# Patient Record
Sex: Female | Born: 1960 | Race: White | Hispanic: No | Marital: Married | Smoking: Never smoker
Health system: Southern US, Community
[De-identification: ages and names within clinical notes are randomized; demographics above are authoritative.]

## PROBLEM LIST (undated history)

## (undated) DIAGNOSIS — K219 Gastro-esophageal reflux disease without esophagitis: Secondary | ICD-10-CM

## (undated) DIAGNOSIS — B019 Varicella without complication: Secondary | ICD-10-CM

## (undated) DIAGNOSIS — T7840XA Allergy, unspecified, initial encounter: Secondary | ICD-10-CM

## (undated) DIAGNOSIS — I10 Essential (primary) hypertension: Secondary | ICD-10-CM

## (undated) DIAGNOSIS — E041 Nontoxic single thyroid nodule: Secondary | ICD-10-CM

## (undated) DIAGNOSIS — G629 Polyneuropathy, unspecified: Secondary | ICD-10-CM

## (undated) DIAGNOSIS — K2970 Gastritis, unspecified, without bleeding: Secondary | ICD-10-CM

## (undated) DIAGNOSIS — M1990 Unspecified osteoarthritis, unspecified site: Secondary | ICD-10-CM

## (undated) HISTORY — DX: Gastro-esophageal reflux disease without esophagitis: K21.9

## (undated) HISTORY — DX: Unspecified osteoarthritis, unspecified site: M19.90

## (undated) HISTORY — DX: Polyneuropathy, unspecified: G62.9

## (undated) HISTORY — DX: Nontoxic single thyroid nodule: E04.1

## (undated) HISTORY — PX: OTHER SURGICAL HISTORY: SHX169

## (undated) HISTORY — DX: Gastritis, unspecified, without bleeding: K29.70

## (undated) HISTORY — DX: Essential (primary) hypertension: I10

## (undated) HISTORY — DX: Allergy, unspecified, initial encounter: T78.40XA

## (undated) HISTORY — DX: Varicella without complication: B01.9

---

## 2012-03-21 LAB — HEPATIC FUNCTION PANEL
ALT: 12 U/L (ref 7–35)
AST: 18 U/L (ref 13–35)
Alkaline Phosphatase: 75 U/L (ref 25–125)
Bilirubin, Total: 0.4 mg/dL

## 2012-03-21 LAB — TSH: TSH: 0.95 u[IU]/mL (ref 0.41–5.90)

## 2012-03-21 LAB — BASIC METABOLIC PANEL
BUN: 14 mg/dL (ref 4–21)
Glucose: 79 mg/dL
Sodium: 138 mmol/L (ref 137–147)

## 2012-03-29 ENCOUNTER — Encounter: Payer: Self-pay | Admitting: Internal Medicine

## 2012-04-20 ENCOUNTER — Encounter: Payer: Self-pay | Admitting: *Deleted

## 2012-05-03 ENCOUNTER — Encounter: Payer: Self-pay | Admitting: Internal Medicine

## 2012-05-26 ENCOUNTER — Other Ambulatory Visit (INDEPENDENT_AMBULATORY_CARE_PROVIDER_SITE_OTHER): Payer: 59

## 2012-05-26 ENCOUNTER — Encounter: Payer: Self-pay | Admitting: Internal Medicine

## 2012-05-26 ENCOUNTER — Ambulatory Visit (INDEPENDENT_AMBULATORY_CARE_PROVIDER_SITE_OTHER): Payer: 59 | Admitting: Internal Medicine

## 2012-05-26 VITALS — BP 130/80 | HR 66 | Ht 66.5 in | Wt 178.0 lb

## 2012-05-26 DIAGNOSIS — K3189 Other diseases of stomach and duodenum: Secondary | ICD-10-CM

## 2012-05-26 DIAGNOSIS — R140 Abdominal distension (gaseous): Secondary | ICD-10-CM

## 2012-05-26 DIAGNOSIS — R141 Gas pain: Secondary | ICD-10-CM

## 2012-05-26 DIAGNOSIS — R1013 Epigastric pain: Secondary | ICD-10-CM

## 2012-05-26 DIAGNOSIS — R142 Eructation: Secondary | ICD-10-CM

## 2012-05-26 MED ORDER — ALIGN 4 MG PO CAPS: 1.0000 | ORAL_CAPSULE | Freq: Every day | ORAL | Status: DC

## 2012-05-26 MED ORDER — MOVIPREP 100 G PO SOLR: 1.0000 | Freq: Once | ORAL | Status: DC

## 2012-05-26 NOTE — Patient Instructions (Addendum)
You have been scheduled for an endoscopy and colonoscopy with propofol. Please follow the written instructions given to you at your visit today. Please pick up your prep at the pharmacy within the next 1-3 days.  If you use inhalers (even only as needed), please bring them with you on the day of your procedure.  Your physician has requested that you go to www.startemmi.com and enter the access code given to you at your visit today. This web site gives a general overview about your procedure. However, you should still follow specific instructions given to you by our office regarding your preparation for the procedure.  You have been scheduled for an abdominal ultrasound at [facility] (1st floor of hospital) on 06/01/12 at 8:30 am. Please arrive 15 minutes prior to your appointment for registration. Make certain not to have anything to eat or drink 6 hours prior to your appointment. Should you need to reschedule your appointment, please contact radiology at [phone]. This test typically takes about 30 minutes to perform.  We have given you samples of Align. This puts good bacteria back into your colon. You should take 1 capsule by mouth once daily. If this works well for you, it can be purchased over the counter.  Your physician has requested that you go to the basement for the following lab work before leaving today: CBC, IBC, B12, Sed Rate  We have given you a booklet on a gas prevention diet. Please read over this and follow it.
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
## 2012-05-27 LAB — CBC WITH DIFFERENTIAL/PLATELET
Basophils Absolute: 0 10*3/uL (ref 0.0–0.1)
Eosinophils Relative: 1.1 % (ref 0.0–5.0)
HCT: 42.7 % (ref 36.0–46.0)
Hemoglobin: 14.6 g/dL (ref 12.0–15.0)
Lymphocytes Relative: 41.5 % (ref 12.0–46.0)
Monocytes Relative: 8.1 % (ref 3.0–12.0)
Neutro Abs: 3 10*3/uL (ref 1.4–7.7)
RDW: 12.2 % (ref 11.5–14.6)
WBC: 6.1 10*3/uL (ref 4.5–10.5)

## 2012-05-27 LAB — SEDIMENTATION RATE: Sed Rate: 16 mm/hr (ref 0–22)

## 2012-05-27 LAB — IBC PANEL
Iron: 46 ug/dL (ref 42–145)
Saturation Ratios: 11.2 % — ABNORMAL LOW (ref 20.0–50.0)

## 2012-05-27 LAB — VITAMIN B12: Vitamin B-12: 400 pg/mL (ref 211–911)

## 2012-05-30 ENCOUNTER — Encounter: Payer: Self-pay | Admitting: Internal Medicine

## 2012-06-01 ENCOUNTER — Ambulatory Visit (HOSPITAL_COMMUNITY)
Admission: RE | Admit: 2012-06-01 | Discharge: 2012-06-01 | Disposition: A | Discharge status: Home / Self Care | Payer: 59 | Source: Ambulatory Visit | Attending: Internal Medicine | Admitting: Internal Medicine

## 2012-06-01 DIAGNOSIS — R140 Abdominal distension (gaseous): Secondary | ICD-10-CM

## 2012-06-03 ENCOUNTER — Ambulatory Visit (HOSPITAL_COMMUNITY)
Admission: RE | Admit: 2012-06-03 | Discharge: 2012-06-03 | Disposition: A | Discharge status: Home / Self Care | Payer: 59 | Source: Ambulatory Visit | Attending: Internal Medicine | Admitting: Internal Medicine

## 2012-06-03 DIAGNOSIS — R141 Gas pain: Secondary | ICD-10-CM | POA: Insufficient documentation

## 2012-06-03 DIAGNOSIS — R143 Flatulence: Secondary | ICD-10-CM | POA: Insufficient documentation

## 2012-06-03 DIAGNOSIS — R142 Eructation: Secondary | ICD-10-CM | POA: Insufficient documentation

## 2012-08-03 ENCOUNTER — Encounter: Payer: Self-pay | Admitting: Internal Medicine

## 2012-08-03 ENCOUNTER — Ambulatory Visit (AMBULATORY_SURGERY_CENTER): Payer: 59 | Admitting: Internal Medicine

## 2012-08-03 VITALS — BP 134/85 | HR 60 | Temp 98.3°F | Resp 30 | Ht 66.0 in | Wt 178.0 lb

## 2012-08-03 DIAGNOSIS — R141 Gas pain: Secondary | ICD-10-CM

## 2012-08-03 DIAGNOSIS — K2960 Other gastritis without bleeding: Secondary | ICD-10-CM

## 2012-08-03 DIAGNOSIS — Z1211 Encounter for screening for malignant neoplasm of colon: Secondary | ICD-10-CM

## 2012-08-03 DIAGNOSIS — R143 Flatulence: Secondary | ICD-10-CM

## 2012-08-03 MED ORDER — SODIUM CHLORIDE 0.9 % IV SOLN: 500.0000 mL | INTRAVENOUS | Status: DC

## 2012-08-03 MED ORDER — OMEPRAZOLE 20 MG PO CPDR: 20.0000 mg | DELAYED_RELEASE_CAPSULE | Freq: Every day | ORAL | Status: DC

## 2012-08-03 NOTE — Progress Notes (Signed)
Lidocaine-40mg IV prior to Propofol InductionPropofol given over incremental dosages 

## 2012-08-03 NOTE — Op Note (Signed)
[facility] [address] [place], [address]   COLONOSCOPY PROCEDURE REPORT  PATIENT: Kathleen, Weber  MR#: [id number] BIRTHDATE: January 11, 1961 , 51  yrs. old GENDER: Female ENDOSCOPIST: Hart Carwin, MD REFERRED BY:  none PROCEDURE DATE:  08/03/2012 PROCEDURE:   Colonoscopy, screening ASA CLASS:   Class I INDICATIONS:Average risk patient for colon cancer and pt has been experiencing bloating. MEDICATIONS: MAC sedation, administered by CRNA and propofol (Diprivan) 100mg  IV  DESCRIPTION OF PROCEDURE:   After the risks and benefits and of the procedure were explained, informed consent was obtained.  A digital rectal exam revealed no abnormalities of the rectum.    The LB [id number]  endoscope was introduced through the anus and advanced to the cecum, which was identified by both the appendix and ileocecal valve .  The quality of the prep was good, using MoviPrep .  The instrument was then slowly withdrawn as the colon was fully examined.     COLON FINDINGS: A normal appearing cecum, ileocecal valve, and appendiceal orifice were identified.  The ascending, hepatic flexure, transverse, splenic flexure, descending, sigmoid colon and rectum appeared unremarkable.  No polyps or cancers were seen. Retroflexed views revealed no abnormalities.     The scope was then withdrawn from the patient and the procedure completed.  COMPLICATIONS: There were no complications. ENDOSCOPIC IMPRESSION: Normal colon  RECOMMENDATIONS: High fiber diet   REPEAT EXAM: In 10 year(s)  for Colonoscopy.  cc:  _______________________________ eSignedHart Carwin, MD 08/03/2012 9:52 AM

## 2012-08-03 NOTE — Op Note (Signed)
[facility] [address] [place], [address]   ENDOSCOPY PROCEDURE REPORT  PATIENT: Kathleen Weber, Kathleen Weber  MR#: [id number] BIRTHDATE: 03-06-1960 , 51  yrs. old GENDER: Female ENDOSCOPIST: Hart Carwin, MD REFERRED BY:  none PROCEDURE DATE:  08/03/2012 PROCEDURE:  EGD w/ biopsy ASA CLASS:     Class I INDICATIONS:  Dyspepsia.   mother had gastric lymphoma, iron deficiency. MEDICATIONS: MAC sedation, administered by CRNA and propofol (Diprivan) 200mg  IV TOPICAL ANESTHETIC: none  DESCRIPTION OF PROCEDURE: After the risks benefits and alternatives of the procedure were thoroughly explained, informed consent was obtained.  The LB [id number] endoscope was introduced through the mouth and advanced to the second portion of the duodenum. Without limitations.  The instrument was slowly withdrawn as the mucosa was fully examined.      esophagus: Esophageal mucosa appeared normal in the proximal mid and distal esophagus. Z line was irregular. There was a short column of gastric mucosa which was biopsied to rule out Barrett's esophagus. There was no stricture or hiatal hernia Stomach: Gastric mucosa appeared normal in the body of the stomach. There were multiple superficial erosions in the gastric antrum with specks of blood coating the gastric mucosa. Biopsies were taken to rule out H. pylori. Gastric outlet was normal. Retroflexion of the endoscope revealed normal fundus and cardia Duodenum duodenal bulb and descending duodenum was normal biopsies were taken from second portion duodenum to rule out villous atrophy[         The scope was then withdrawn from the patient and the procedure completed.  COMPLICATIONS: There were no complications. ENDOSCOPIC IMPRESSION:  irregular Z line, status post biopsies Antral gastritis status post biopsies rule out H. pylori Status post small bowel biopsies to rule out sprue RECOMMENDATIONS: Await pathology results acid  reduction Rx avoid NSAID's proceed with colonoscopy  REPEAT EXAM: for EGD pending biopsy results.  eSigned:  Hart Carwin, MD 08/03/2012 9:49 AM   CC:  PATIENT NAME:  [name], [name] MR#: [id number]

## 2012-08-03 NOTE — Progress Notes (Signed)
Called to room to assist during endoscopic procedure.  Patient ID and intended procedure confirmed with present staff. Received instructions for my participation in the procedure from the performing physician.  

## 2012-08-03 NOTE — Patient Instructions (Addendum)
Discharge instructions given with verbal understanding. Handout on gastritis given. Resume previous medications. YOU HAD AN ENDOSCOPIC PROCEDURE TODAY AT THE [facility]: Refer to the procedure report that was given to you for any specific questions about what was found during the examination.  If the procedure report does not answer your questions, please call your gastroenterologist to clarify.  If you requested that your care partner not be given the details of your procedure findings, then the procedure report has been included in a sealed envelope for you to review at your convenience later.  YOU SHOULD EXPECT: Some feelings of bloating in the abdomen. Passage of more gas than usual.  Walking can help get rid of the air that was put into your GI tract during the procedure and reduce the bloating. If you had a lower endoscopy (such as a colonoscopy or flexible sigmoidoscopy) you may notice spotting of blood in your stool or on the toilet paper. If you underwent a bowel prep for your procedure, then you may not have a normal bowel movement for a few days.  DIET: Your first meal following the procedure should be a light meal and then it is ok to progress to your normal diet.  A half-sandwich or bowl of soup is an example of a good first meal.  Heavy or fried foods are harder to digest and may make you feel nauseous or bloated.  Likewise meals heavy in dairy and vegetables can cause extra gas to form and this can also increase the bloating.  Drink plenty of fluids but you should avoid alcoholic beverages for 24 hours.  ACTIVITY: Your care partner should take you home directly after the procedure.  You should plan to take it easy, moving slowly for the rest of the day.  You can resume normal activity the day after the procedure however you should NOT DRIVE or use heavy machinery for 24 hours (because of the sedation medicines used during the test).    SYMPTOMS TO REPORT IMMEDIATELY: A  gastroenterologist can be reached at any hour.  During normal business hours, 8:30 AM to 5:00 PM Monday through Friday, call [phone].  After hours and on weekends, please call the GI answering service at [phone] who will take a message and have the physician on call contact you.   Following lower endoscopy (colonoscopy or flexible sigmoidoscopy):  Excessive amounts of blood in the stool  Significant tenderness or worsening of abdominal pains  Swelling of the abdomen that is new, acute  Fever of 100F or higher  Following upper endoscopy (EGD)  Vomiting of blood or coffee ground material  New chest pain or pain under the shoulder blades  Painful or persistently difficult swallowing  New shortness of breath  Fever of 100F or higher  Black, tarry-looking stools  FOLLOW UP: If any biopsies were taken you will be contacted by phone or by letter within the next 1-3 weeks.  Call your gastroenterologist if you have not heard about the biopsies in 3 weeks.  Our staff will call the home number listed on your records the next business day following your procedure to check on you and address any questions or concerns that you may have at that time regarding the information given to you following your procedure. This is a courtesy call and so if there is no answer at the home number and we have not heard from you through the emergency physician on call, we will assume that you have returned   to your regular daily activities without incident.  SIGNATURES/CONFIDENTIALITY: You and/or your care partner have signed paperwork which will be entered into your electronic medical record.  These signatures attest to the fact that that the information above on your After Visit Summary has been reviewed and is understood.  Full responsibility of the confidentiality of this discharge information lies with you and/or your care-partner.  

## 2012-08-03 NOTE — Progress Notes (Signed)
Patient did not experience any of the following events: a burn prior to discharge; a fall within the facility; wrong site/side/patient/procedure/implant event; or a hospital transfer or hospital admission upon discharge from the facility. (G8907) Patient did not have preoperative order for IV antibiotic SSI prophylaxis. (G8918)  

## 2012-08-04 ENCOUNTER — Telehealth: Payer: Self-pay | Admitting: *Deleted

## 2012-08-04 NOTE — Telephone Encounter (Signed)
  Follow up Call-  Call back number 08/03/2012  Post procedure Call Back phone  # [phone]  Permission to leave phone message Yes     Patient questions:  Do you have a fever, pain , or abdominal swelling? no Pain Score  0 *  Have you tolerated food without any problems? yes  Have you been able to return to your normal activities? yes  Do you have any questions about your discharge instructions: Diet   no Medications  no Follow up visit  no  Do you have questions or concerns about your Care? no  Actions: * If pain score is 4 or above: No action needed, pain <4.

## 2012-08-08 ENCOUNTER — Encounter: Payer: Self-pay | Admitting: Internal Medicine

## 2012-11-09 LAB — HM MAMMOGRAPHY

## 2012-12-15 ENCOUNTER — Ambulatory Visit: Payer: 59 | Admitting: Internal Medicine

## 2012-12-19 ENCOUNTER — Encounter: Payer: Self-pay | Admitting: Internal Medicine

## 2012-12-19 ENCOUNTER — Ambulatory Visit (INDEPENDENT_AMBULATORY_CARE_PROVIDER_SITE_OTHER): Payer: 59 | Admitting: Internal Medicine

## 2012-12-19 VITALS — BP 122/82 | HR 78 | Temp 98.3°F | Ht 66.5 in | Wt 178.0 lb

## 2012-12-19 DIAGNOSIS — I10 Essential (primary) hypertension: Secondary | ICD-10-CM

## 2012-12-19 DIAGNOSIS — R200 Anesthesia of skin: Secondary | ICD-10-CM | POA: Insufficient documentation

## 2012-12-19 DIAGNOSIS — E041 Nontoxic single thyroid nodule: Secondary | ICD-10-CM

## 2012-12-19 DIAGNOSIS — R209 Unspecified disturbances of skin sensation: Secondary | ICD-10-CM

## 2012-12-19 DIAGNOSIS — J309 Allergic rhinitis, unspecified: Secondary | ICD-10-CM

## 2012-12-19 DIAGNOSIS — E663 Overweight: Secondary | ICD-10-CM

## 2012-12-19 MED ORDER — AMLODIPINE BESYLATE 5 MG PO TABS: 5.0000 mg | ORAL_TABLET | Freq: Every day | ORAL | Status: DC

## 2012-12-19 MED ORDER — VALSARTAN 320 MG PO TABS: 320.0000 mg | ORAL_TABLET | Freq: Every day | ORAL | Status: DC

## 2012-12-19 NOTE — Progress Notes (Signed)
Subjective:    Patient ID: Kathleen Weber, female    DOB: Dec 20, 1960, 52 y.o.   MRN: [id number]  HPI New patient to me, here to establish primary care provider Reviewed chronic medical issues  Hypertension. On combination amlodipine with ARB for same. Strong family history of same. Denies edema, headache or chest pain symptoms. Reports compliance with medications as prescribed and follows a low-sodium diet  Neuropathy. Describes a cervical radicular symptoms causing hand numbness symptoms and pain upon waking. Improves with change of head position and different pillow. MRI C-spine performed in [place] 2013 prior to moving to [place]. Denies weakness. Review by urologist in [place] upon arrival to [place], declines nerve conduction study at symptoms are managed conservatively. Symptoms did not improve with use of night splint wrist brace  Thyroid nodule. Incidental finding on MRI C-spine. Has been evaluated by endocrinologist at [facility] for same. Reports annual ultrasound each April has been without change. No bowel changes, skin changes, hair changes or trouble swallowing  Past Medical History  Diagnosis Date  . Hypertension   . Thyroid nodule   . Neuropathy   . Arthritis   . Chicken pox    Family History  Problem Relation Age of Onset  . Stomach cancer Mother   . Diabetes Father   . Heart disease Mother   . Coronary artery disease Father 52    CABGx5  . Kidney disease Mother     kidney cancer  . Lymphoma Maternal Grandmother   . Stroke Father     TIAs  . Hypertension Mother   . Hypertension Father    History  Substance Use Topics  . Smoking status: Never Smoker   . Smokeless tobacco: Never Used  . Alcohol Use: No     Review of Systems  Constitutional: Negative for fatigue and unexpected weight change.  Respiratory: Negative for cough, shortness of breath and wheezing.   Cardiovascular: Negative for chest pain, palpitations and leg swelling.   Gastrointestinal: Negative for nausea, abdominal pain and diarrhea.  Neurological: Negative for dizziness, weakness, light-headedness and headaches.  Psychiatric/Behavioral: Negative for dysphoric mood. The patient is not nervous/anxious.   All other systems reviewed and are negative.       Objective:   Physical Exam BP 122/82  Pulse 78  Temp(Src) 98.3 F (36.8 C) (Oral)  Ht 5' 6.5" (1.689 m)  Wt 178 lb (80.74 kg)  BMI 28.30 kg/m2  SpO2 98% Wt Readings from Last 3 Encounters:  12/19/12 178 lb (80.74 kg)  08/03/12 178 lb (80.74 kg)  05/26/12 178 lb (80.74 kg)   Constitutional: She appears well-developed and well-nourished. No distress. fianc [place] at side HENT: Head: Normocephalic and atraumatic. Ears: B TMs ok, no erythema or effusion; Nose: Nose normal. Mouth/Throat: Oropharynx is clear and moist. No oropharyngeal exudate.  Eyes: Conjunctivae and EOM are normal. Pupils are equal, round, and reactive to light. No scleral icterus.  Neck: Normal range of motion. Neck supple. No JVD present. No thyromegaly present.  Cardiovascular: Normal rate, regular rhythm and normal heart sounds.  No murmur heard. No BLE edema. Pulmonary/Chest: Effort normal and breath sounds normal. No respiratory distress. She has no wheezes.  Abdominal: Soft. Bowel sounds are normal. She exhibits no distension. There is no tenderness. no masses Musculoskeletal: Normal range of motion, no joint effusions. No gross deformities Neurological: She is alert and oriented to person, place, and time. No cranial nerve deficit. Coordination, balance, strength, speech and gait are normal.  Skin: Skin is  warm and dry. No rash noted. No erythema.  Psychiatric: She has a normal mood and affect. Her behavior is normal. Judgment and thought content normal.   Lab Results  Component Value Date   WBC 6.1 05/26/2012   HGB 14.6 05/26/2012   HCT 42.7 05/26/2012   PLT 218.0 05/26/2012         Assessment & Plan:   See  problem list. Medications and labs reviewed today.  Time spent with pt/family today 45 minutes, greater than 50% time spent counseling patient on hypertension, weight/nutrition counseling and medication review. Also review of prior records with [facility] (gyn as pt reports local gyn has [place] records)

## 2012-12-19 NOTE — Patient Instructions (Signed)
It was good to see you today.  We have reviewed your prior records including labs and tests today  we will send to your prior provider(s) for "release of records" as discussed today.  Medications reviewed and updated, no changes recommended at this time.  Read "Wheat Belly" and I will look into nutritionist referral  Please schedule followup in 6-12 months, call sooner if problems.

## 2012-12-19 NOTE — Assessment & Plan Note (Signed)
Wt Readings from Last 3 Encounters:  12/19/12 178 lb (80.74 kg)  08/03/12 178 lb (80.74 kg)  05/26/12 178 lb (80.74 kg)   The patient is asked to make an attempt to improve diet and exercise patterns to aid in medical management of this problem.

## 2012-12-19 NOTE — Assessment & Plan Note (Signed)
No symptoms of hypo-or hyperthyroid Reports prior endocrine evaluation unremarkable Annual soft tissue ultrasound each April without change Plan to follow here with primary for same, endocrinology only if problems The current medical regimen is effective;  continue present plan and medications.

## 2012-12-19 NOTE — Progress Notes (Signed)
Pre-visit discussion using our clinic review tool. No additional management support is needed unless otherwise documented below in the visit note.  

## 2012-12-19 NOTE — Assessment & Plan Note (Signed)
Chronic symptoms, related to cervical radiculopathy per patient report of prior workup in [place] MRI C-spine performed in [place] 2013, but no surgery or other intervention needed  Control symptoms with position while sleeping  No improvement with use of night hand splints, but has declined nerve conduction study to evaluate for possible carpal tunnel  Neuro establishment with [facility] early 2014, but as symptoms controlled with conservative care, does not plan to followup there

## 2012-12-19 NOTE — Assessment & Plan Note (Signed)
BP Readings from Last 3 Encounters:  12/19/12 122/82  08/03/12 134/85  05/26/12 130/80   The current medical regimen is effective;  continue present plan and medications.

## 2012-12-28 ENCOUNTER — Telehealth: Payer: Self-pay | Admitting: *Deleted

## 2012-12-28 DIAGNOSIS — E663 Overweight: Secondary | ICD-10-CM

## 2012-12-28 NOTE — Telephone Encounter (Signed)
Left msg on vm have several question to ask. Requesting call bck. Called pt bck no answer LMOM RTC...Raechel Chute

## 2012-12-28 NOTE — Telephone Encounter (Signed)
Pt call bck wanting to know had md received records form her previous md. Concern about mammogram. Also wanted to know status on nutritionist referral. She states md told her she will check on getting a nutritionist. No order in comp...Raechel Chute

## 2012-12-29 NOTE — Telephone Encounter (Signed)
A) no records from prior provider B) what mammo concern? Abnormal or need refer - if pt has information, she can drop this copy at the office for me to review 3) i will refer to integrative therapies for nutrition - if this is not a good fit, we can try another provider down the road -  thanks

## 2013-01-02 NOTE — Telephone Encounter (Signed)
Pt was made aware on 12/28/12 that we haven't received records back yet. Will be contacted once Nutritionist referral has been set-up...lmb

## 2013-01-31 ENCOUNTER — Encounter: Payer: Self-pay | Admitting: Internal Medicine

## 2013-03-27 ENCOUNTER — Ambulatory Visit (INDEPENDENT_AMBULATORY_CARE_PROVIDER_SITE_OTHER): Payer: 59 | Admitting: Internal Medicine

## 2013-03-27 ENCOUNTER — Encounter: Payer: Self-pay | Admitting: Internal Medicine

## 2013-03-27 DIAGNOSIS — S134XXA Sprain of ligaments of cervical spine, initial encounter: Secondary | ICD-10-CM

## 2013-03-27 DIAGNOSIS — R200 Anesthesia of skin: Secondary | ICD-10-CM

## 2013-03-27 DIAGNOSIS — S139XXA Sprain of joints and ligaments of unspecified parts of neck, initial encounter: Secondary | ICD-10-CM

## 2013-03-27 DIAGNOSIS — R209 Unspecified disturbances of skin sensation: Secondary | ICD-10-CM

## 2013-03-27 MED ORDER — CYCLOBENZAPRINE HCL 5 MG PO TABS: 5.0000 mg | ORAL_TABLET | Freq: Every evening | ORAL | Status: DC | PRN

## 2013-03-27 MED ORDER — GABAPENTIN 100 MG PO CAPS: 100.0000 mg | ORAL_CAPSULE | Freq: Every day | ORAL | Status: DC
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
## 2013-03-27 NOTE — Progress Notes (Signed)
Pre-visit discussion using our clinic review tool. No additional management support is needed unless otherwise documented below in the visit note.  

## 2013-03-27 NOTE — Patient Instructions (Addendum)
It was good to see you today.  We have reviewed your prior records including labs and tests today  Ok to resume gabapentin as needed for "nerve" shooting pains  Use muscle relaxer as needed at night for neck spasm  Your prescription(s) have been submitted to your pharmacy. Please take as directed and contact our office if you believe you are having problem(s) with the medication(s).  Alternate between ibuprofen and tylenol for aches, pain and fever symptoms as discussed  Cervical Sprain A cervical sprain is when the tissues (ligaments) that hold the neck bones in place stretch or tear. HOME CARE   Put ice on the injured area.  Put ice in a plastic bag.  Place a towel between your skin and the bag.  Leave the ice on for 15 20 minutes, 3 4 times a day.  You may have been given a collar to wear. This collar keeps your neck from moving while you heal.  Do not take the collar off unless told by your doctor.  If you have long hair, keep it outside of the collar.  Ask your doctor before changing the position of your collar. You may need to change its position over time to make it more comfortable.  If you are allowed to take off the collar for cleaning or bathing, follow your doctor's instructions on how to do it safely.  Keep your collar clean by wiping it with mild soap and water. Dry it completely. If the collar has removable pads, remove them every 1 2 days to hand wash them with soap and water. Allow them to air dry. They should be dry before you wear them in the collar.  Do not drive while wearing the collar.  Only take medicine as told by your doctor.  Keep all doctor visits as told.  Keep all physical therapy visits as told.  Adjust your work station so that you have good posture while you work.  Avoid positions and activities that make your problems worse.  Warm up and stretch before being active. GET HELP IF:  Your pain is not controlled with medicine.  You  cannot take less pain medicine over time as planned.  Your activity level does not improve as expected. GET HELP RIGHT AWAY IF:   You are bleeding.  Your stomach is upset.  You have an allergic reaction to your medicine.  You develop new problems that you cannot explain.  You lose feeling (become numb) or you cannot move any part of your body (paralysis).  You have tingling or weakness in any part of your body.  Your symptoms get worse. Symptoms include:  Pain, soreness, stiffness, puffiness (swelling), or a burning feeling in your neck.  Pain when your neck is touched.  Shoulder or upper back pain.  Limited ability to move your neck.  Headache.  Dizziness.  Your hands or arms feel week, lose feeling, or tingle.  Muscle spasms.  Difficulty swallowing or chewing. MAKE SURE YOU:   Understand these instructions.  Will watch your condition.  Will get help right away if you are not doing well or get worse. Document Released: 07/15/2007 Document Revised: 09/28/2012 Document Reviewed: 08/03/2012 [facility] Patient Information 2014 [place], [place].
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
## 2013-04-07 ENCOUNTER — Telehealth: Payer: Self-pay | Admitting: Internal Medicine

## 2013-04-07 DIAGNOSIS — R200 Anesthesia of skin: Secondary | ICD-10-CM

## 2013-04-07 DIAGNOSIS — M542 Cervicalgia: Secondary | ICD-10-CM

## 2013-04-07 NOTE — Telephone Encounter (Signed)
[facility]

## 2013-04-07 NOTE — Telephone Encounter (Signed)
Called pt no answer LMOM referral has been place.../lmb 

## 2013-04-07 NOTE — Telephone Encounter (Signed)
Patient is not feeling any better.  She stated that Dr. Felicity CoyerLeschber would give her a referral to a neurologist if not feeling better.  She would like to go ahead with the referral.

## 2013-04-13 ENCOUNTER — Other Ambulatory Visit: Payer: Self-pay | Admitting: Internal Medicine

## 2013-05-03 ENCOUNTER — Encounter: Payer: Self-pay | Admitting: Neurology

## 2013-05-03 ENCOUNTER — Ambulatory Visit (INDEPENDENT_AMBULATORY_CARE_PROVIDER_SITE_OTHER): Payer: 59 | Admitting: Neurology

## 2013-05-03 VITALS — BP 130/72 | HR 78 | Temp 98.0°F | Resp 18 | Ht 68.0 in | Wt 180.7 lb

## 2013-05-03 DIAGNOSIS — M79609 Pain in unspecified limb: Secondary | ICD-10-CM

## 2013-05-03 DIAGNOSIS — M79605 Pain in left leg: Secondary | ICD-10-CM

## 2013-05-03 DIAGNOSIS — R200 Anesthesia of skin: Secondary | ICD-10-CM

## 2013-05-03 DIAGNOSIS — R209 Unspecified disturbances of skin sensation: Secondary | ICD-10-CM
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
## 2013-05-03 NOTE — Patient Instructions (Signed)
The pain in the lower leg may be due to a pinched nerve in the back.  Over the next week or so, monitor if symptoms continue to improve.  Otherwise, we can consider referral to physical therapy or increase gabapentin.  The numbness in the hands seem more likely carpal tunnel than from the neck.  To sort this out, we will schedule the EMG.  It tests for problems in the nerves as well as in the neck.  Follow up soon after the EMG.

## 2013-06-23 ENCOUNTER — Encounter: Payer: Self-pay | Admitting: Internal Medicine

## 2013-06-23 ENCOUNTER — Ambulatory Visit (INDEPENDENT_AMBULATORY_CARE_PROVIDER_SITE_OTHER): Payer: 59 | Admitting: Internal Medicine

## 2013-06-23 VITALS — BP 120/82 | HR 75 | Temp 97.6°F | Resp 12 | Wt 180.4 lb

## 2013-06-23 DIAGNOSIS — R198 Other specified symptoms and signs involving the digestive system and abdomen: Secondary | ICD-10-CM

## 2013-06-23 NOTE — Progress Notes (Signed)
Pre visit review using our clinic review tool, if applicable. No additional management support is needed unless otherwise documented below in the visit note. 

## 2013-06-23 NOTE — Patient Instructions (Signed)
Please take a probiotic , Florastor OR Align, every day until the bowels are normal. This will replace the normal bacteria which  are necessary for formation of normal stool and processing of food.  Please keep a food diary of possible food triggers. If you have frank diarrhea (frankly watery stool) look @  the previous one to 2 meals. For example fatty or greasy foods might exacerbate gallbladder disease. Other food triggers for bowel dysfunction include lactose (milk sugar) or wheat / gluten which can cause Sprue.

## 2013-06-23 NOTE — Progress Notes (Signed)
   Subjective:    Patient ID: Kathleen Weber, female    DOB: 06-11-1960, 53 y.o.   MRN: [id number]  HPI   She is having a flare of loose to watery bowels over the last 2 weeks. This has actually been an intermittent problem for 2 years. The last 2 weeks each morning after a breakfast consisting of bananas and 3 cups of coffee with nondairy creamer she's had one or 2 loose to watery stools  She is taken no antibiotics since April.  She describes sweats but she attributes these to peri menopausal state  She's had intermittent bloating for 2 years which she feels is dietary related as well  There have been some job  and family stresses.  She's had an extensive evaluation the past for gluten enteropathy which was negative  In 2013 she had negative colonoscopy and upper endoscopy  Her mother had stomach cancer.  Review of Systems  She specifically denies nausea, vomiting, dyspepsia, abdominal pain, vomiting, melena, or rectal bleeding. Weight is stable.  She has no fever or chills.  There is no associated lightheadedness or decrease in urine volume.  She has no dysuria, pyuria, or hematuria.        Objective:   Physical Exam General appearance is one of good health and nourishment w/o distress.  Eyes: No conjunctival inflammation or scleral icterus is present.  Oral exam: Dental hygiene is good; lips and gums are healthy appearing.There is no oropharyngeal erythema or exudate noted.   Heart:  Normal rate and regular rhythm. S1 and S2 normal without gallop, murmur, click, rub or other extra sounds     Lungs:Chest clear to auscultation; no wheezes, rhonchi,rales ,or rubs present.No increased work of breathing.   Abdomen: bowel sounds normal, soft and non-tender without masses, organomegaly or hernias noted.  No guarding or rebound . No tenderness over the flanks to percussion  Musculoskeletal: Able to lie flat and sit up without help. Negative straight leg raising  bilaterally. Gait normal  Skin:Warm & dry.  Intact without suspicious lesions or rashes ; no jaundice or tenting  Lymphatic: No lymphadenopathy is noted about the head, neck, axilla.              Assessment & Plan:  #1 loose to watery stools, one-2 times in the morning after breakfast. This may be related to the nondairy creamer. It's possible it is related to content of meal the night before.  #2 bloating; she's been asked to followup with her gynecologist. She is on an Agiotensin receptor blocker but has been on this for numerous years. It is unlikely that this is an angioedema variant.  At this time I asked her to wean her coffee and non-dairy creamer in the morning to 1 cup if possible. Also I asked her to take a probiotic each day.  Librax or other antispasmotic was not felt to be clinically indicated at this time.

## 2013-06-28 ENCOUNTER — Telehealth: Payer: Self-pay | Admitting: Internal Medicine

## 2013-06-28 NOTE — Telephone Encounter (Signed)
Spoke with patient and she is having episode of diarrhea every AM after eating breakfast. She reports this started 3 weeks ago. Only happens in AM and she has one or two diarrhea stools. She saw her PCP and was told to take a Probiotic which she has done without improvement. She was on antibiotics when it started. Denies pain, nausea or vomiting. Offered OV with extender at 3:00 PM tomorrow but she has an OV with her GYN at that time. Scheduled on 07/04/13 at 3:00 Pm with Amy Esterwood, PA.

## 2013-06-28 NOTE — Telephone Encounter (Signed)
Left a message for patient to call back. 

## 2013-07-04 ENCOUNTER — Encounter: Payer: Self-pay | Admitting: Physician Assistant

## 2013-07-04 ENCOUNTER — Other Ambulatory Visit: Payer: 59

## 2013-07-04 ENCOUNTER — Ambulatory Visit (INDEPENDENT_AMBULATORY_CARE_PROVIDER_SITE_OTHER): Payer: 59 | Admitting: Physician Assistant

## 2013-07-04 VITALS — BP 118/72 | HR 82 | Ht 66.5 in | Wt 179.0 lb

## 2013-07-04 DIAGNOSIS — R197 Diarrhea, unspecified: Secondary | ICD-10-CM

## 2013-07-04 MED ORDER — METRONIDAZOLE 250 MG PO TABS: 250.0000 mg | ORAL_TABLET | Freq: Three times a day (TID) | ORAL | Status: AC

## 2013-07-04 NOTE — Progress Notes (Signed)
Reviewed and agree with empirical Flagyl

## 2013-07-04 NOTE — Progress Notes (Signed)
Subjective:    Patient ID: Kathleen Weber, female    DOB: 09-09-1960, 53 y.o.   MRN: [id number]  HPI Addison is a pleasant 53 year old female known to Dr. Lina Sar who had undergone screening colonoscopy last summer in June of 2014 this was a normal exam. She also had EGD done at that time was found to have antral gastritis. Small bowel biopsies were done due to complaints of intermittent diarrhea and these were negative for celiac disease ,H. pylori was also negative on gastric biopsies. Patient comes in today do to change in her bowel habits over the past month. She says she took a course of antibiotics in April and believes the diarrhea started about the time she finished the antibiotics. Her symptoms have been persistent since. She is generally having one to 2 bowel movements per day always in the morning and says she spine the rest of the day. She is not having any normal bowel movements and says her bowel movements are all loose to liquid. Stools are not particularly malodorous there's been no melena or hematochezia. She really has no complaints of abdominal pain or cramping no melena or hematochezia. Appetite has been fine weight has been stable no nausea or vomiting no fever or chills. She has not had any other medication or supplement changes. She says she is worried about cancer. She had recent GYN visit last week and had labs done there TSH is normal at 0.65 CBC was 5.1 hemoglobin 15 hematocrit of 43.3 and seen that was unremarkable.    Review of Systems  Constitutional: Negative.   HENT: Negative.   Eyes: Negative.   Respiratory: Negative.   Cardiovascular: Negative.   Gastrointestinal: Positive for diarrhea.  Endocrine: Negative.   Genitourinary: Negative.   Musculoskeletal: Negative.   Allergic/Immunologic: Negative.   Neurological: Negative.   Hematological: Negative.   Psychiatric/Behavioral: Negative.    Outpatient Prescriptions Prior to Visit  Medication Sig  Dispense Refill  . amLODipine (NORVASC) 5 MG tablet Take 1 tablet (5 mg total) by mouth at bedtime.  90 tablet  3  . cyclobenzaprine (FLEXERIL) 5 MG tablet TAKE 1 TABLET BY MOUTH AT BEDTIME AS NEEDED FOR MUSCLE SPASMS  20 tablet  0  . Ferrous Sulfate (IRON) 325 (65 FE) MG TABS Take 1 tablet by mouth daily.      Marland Kitchen gabapentin (NEURONTIN) 100 MG capsule Take 1 capsule (100 mg total) by mouth at bedtime.  90 capsule  0  . valsartan (DIOVAN) 320 MG tablet Take 1 tablet (320 mg total) by mouth daily.  90 tablet  3   No facility-administered medications prior to visit.   Allergies  Allergen Reactions  . Codeine Other (See Comments)    Hot flash, nausea, feels faint   Patient Active Problem List   Diagnosis Date Noted  . Hypertension 12/19/2012  . Allergic rhinitis 12/19/2012  . Thyroid nodule   . Bilateral hand numbness   . Overweight    History  Substance Use Topics  . Smoking status: Never Smoker   . Smokeless tobacco: Never Used  . Alcohol Use: No   family history includes Coronary artery disease (age of onset: 36) in her father; Diabetes in her father; Heart disease in her mother; Hypertension in her father and mother; Kidney disease in her mother; Lymphoma in her maternal grandmother; Stomach cancer in her mother; Stroke in her father.     Objective:   Physical Exam  Well-developed white female in no acute distress, accompanied by  her husband blood pressure 118/72 pulse 82 height 5 foot 6 weight 179. HEENT; nontraumatic normocephalic EOMI PERRLA sclera anicteric, Supple; no JVD, Cardiovascular; regular rate and rhythm with S1-S2 no murmur or gallop, Pulmonary ;clear bilaterally, Abdomen ;soft nontender nondistended bowel sounds are active there is no palpable mass or hepatosplenomegaly, Rectal; exam not done, Extremities no clubbing cyanosis or edema skin warm and dry, Psych; mood and affect appropriate       Assessment & Plan:  #541 53 year old female with 1 month history of  change in bowel habits with loose stools, one to 2 bowel movements per day- no other associated symptoms. Onset of symptoms coincide with a recent course of antibiotics. Rule out antibiotic associated diarrhea secondary to alteration of.flora rule out possible low grade C. differential colitis rule out postinfectious IBS #2 history of gastritis #3 hypertension  Plan; GI pathogen panel Continue Florastor  one by mouth twice daily x1 month, she started this last week We'll give her an empiric course of metronidazole 250 mg by mouth 3 times daily with food x10 days. Further workup pending results of pathogen panel in response to metronidazole

## 2013-07-04 NOTE — Patient Instructions (Signed)
Please go to the basement level to our lab for a stool study. Continue the Florastor twice daily for 1 month. We sent a prescription for Flagyl ( Metronidazole)  To [facility] [address]   Don't start the Flagyl until after you bring the stool sample back to our lab.

## 2013-07-06 ENCOUNTER — Other Ambulatory Visit: Payer: Self-pay | Admitting: *Deleted

## 2013-07-06 DIAGNOSIS — M79643 Pain in unspecified hand: Secondary | ICD-10-CM

## 2013-07-06 LAB — GASTROINTESTINAL PATHOGEN PANEL PCR
C. DIFFICILE TOX A/B, PCR: NEGATIVE
Campylobacter, PCR: NEGATIVE
Cryptosporidium, PCR: NEGATIVE
E coli (ETEC) LT/ST PCR: NEGATIVE
E coli (STEC) stx1/stx2, PCR: NEGATIVE
E coli 0157, PCR: NEGATIVE
GIARDIA LAMBLIA, PCR: NEGATIVE
NOROVIRUS, PCR: NEGATIVE
ROTAVIRUS, PCR: NEGATIVE
SALMONELLA, PCR: NEGATIVE
SHIGELLA, PCR: NEGATIVE

## 2013-07-07 ENCOUNTER — Telehealth: Payer: Self-pay | Admitting: Physician Assistant

## 2013-07-07 NOTE — Telephone Encounter (Signed)
Left a message for patient to call back. 

## 2013-07-07 NOTE — Telephone Encounter (Signed)
Spoke with patient and told her Mike Gip, [place] is out of the office and has not reviewed the results but they are negative. She reports she is taking the antibiotic Mike Gip, PA prescribed and is not doing any better. She will continue taking it. She reports she is still having the one urgent soft stool/daily. Please, advise of next step.

## 2013-07-10 ENCOUNTER — Other Ambulatory Visit: Payer: Self-pay | Admitting: *Deleted

## 2013-07-10 MED ORDER — GLYCOPYRROLATE 2 MG PO TABS: ORAL_TABLET | ORAL | Status: DC

## 2013-07-14 ENCOUNTER — Encounter: Payer: Self-pay | Admitting: *Deleted

## 2013-07-17 ENCOUNTER — Encounter: Payer: Self-pay | Admitting: Obstetrics and Gynecology

## 2013-07-20 ENCOUNTER — Encounter: Payer: 59 | Admitting: Neurology

## 2013-07-28 ENCOUNTER — Ambulatory Visit (INDEPENDENT_AMBULATORY_CARE_PROVIDER_SITE_OTHER): Payer: 59 | Admitting: Internal Medicine

## 2013-07-28 ENCOUNTER — Encounter: Payer: Self-pay | Admitting: Internal Medicine

## 2013-07-28 VITALS — BP 110/86 | HR 84 | Ht 66.25 in | Wt 180.4 lb

## 2013-07-28 DIAGNOSIS — K589 Irritable bowel syndrome without diarrhea: Secondary | ICD-10-CM

## 2013-07-28 MED ORDER — GLYCOPYRROLATE 2 MG PO TABS: 2.0000 mg | ORAL_TABLET | Freq: Two times a day (BID) | ORAL | Status: DC

## 2013-07-28 NOTE — Progress Notes (Signed)
Ardith DarkMaria Oser 10-Aug-1960 [id number]  Note: This dictation was prepared with Dragon digital system. Any transcriptional errors that result from this procedure are unintentional.   History of Present Illness:  This is a 53 year old white female with irritable bowel syndrome who has had a change in bowel habits. She describes loose stools starting about year ago. She is slightly improved on antispasmodics and caffeine elimination. Her stool pathogens have been negative including , sedimentation rate, TSH and sprue profile. She has taken Robinul Forte 2 mg  in the morning. She still feels that her stool is not back to normal although she is having only 1-2 bowel movements daily. She has taken probiotics twice a day. She tells me today that as a child, she would have to have a bowel movement after each meal and it was sometimes urgent.    Past Medical History  Diagnosis Date  . Hypertension   . Thyroid nodule   . Neuropathy   . Arthritis   . Chicken pox   . Gastritis     Past Surgical History  Procedure Laterality Date  . Neg hx      Allergies  Allergen Reactions  . Codeine Other (See Comments)    Hot flash, nausea, feels faint    Family history and social history have been reviewed.  Review of Systems:   The remainder of the 10 point ROS is negative except as outlined in the H&P  Physical Exam: General Appearance Well developed, in no distress Eyes  Non icteric  HEENT  Non traumatic, normocephalic  Mouth No lesion, tongue papillated, no cheilosis Neck Supple without adenopathy, thyroid not enlarged, no carotid bruits, no JVD Lungs Clear to auscultation bilaterally COR Normal S1, normal S2, regular rhythm, no murmur, quiet precordium Abdomen soft nontender with normoactive bowel sounds. No distention no tympany noted  Rectal Deferred  Extremities  No pedal edema Skin No lesions Neurological Alert and oriented x 3 Psychological Normal mood and affect  Assessment  and Plan:   Problem #651 53 year old premenopausal  white female with  IBS and minor bowel changes. There is no evidence of malabsorption or FTT. She is up-to-date on her colonoscopy, no evidence of IBD.. I have asked her to try food elimination starting with decreasing caffeine intake, staying on modified  low carbohydrates and high fiber. She will start Benefiber 1 heaping teaspoon daily. She will also try to increase her Robinul Forte to 2 mg twice a day. I will see her in a couple of months before school starts and may consider use of SSRIs to manage her IBS.She  Declined my suggestion for Zoloft today. 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   Lina SarDora Rasha Ibe 07/28/2013

## 2013-07-28 NOTE — Patient Instructions (Signed)
Please purchase the following medications over the counter and take as directed: Benefiber 1 heaping teaspoon daily  Please purchase Metamucil over the counter. Take as directed. Glycopyrolate 2 mg-Take 1 tablet twice daily  Please decrease your caffeine intake.  Please follow up with Dr Juanda ChanceBrodie on 09/26/13 @ 3:45 pm.  CC:Dr Felicity CoyerLeschber

## 2013-09-26 ENCOUNTER — Ambulatory Visit: Payer: 59 | Admitting: Internal Medicine

## 2013-10-05 ENCOUNTER — Other Ambulatory Visit: Payer: Self-pay | Admitting: Obstetrics and Gynecology

## 2013-10-06 LAB — CYTOLOGY - PAP

## 2013-10-30 ENCOUNTER — Telehealth: Payer: Self-pay | Admitting: Neurology

## 2013-10-30 NOTE — Telephone Encounter (Signed)
Left message for patient I do not recall seeing a MRI from any prior physician come into office

## 2013-10-30 NOTE — Telephone Encounter (Signed)
Pt called wanting to confirm if her MRI was faxed over from [place] where she had it done. Please call pt to confirm # [phone]

## 2013-11-01 ENCOUNTER — Telehealth: Payer: Self-pay | Admitting: Neurology

## 2013-11-01 NOTE — Telephone Encounter (Signed)
Please call pt after 230pm, pt has increased leg and hand pain. Wants to discuss meds. CB# [phone] / Sherri S.

## 2013-11-01 NOTE — Telephone Encounter (Signed)
Patient called stating she is having leg and hand pain really bad now it is waking her up early in the morning and she can almost cry at times . She is asking should she increase the Neurontin ? She did not have the EMG as ordered.

## 2013-11-02 ENCOUNTER — Encounter: Payer: Self-pay | Admitting: Internal Medicine

## 2013-11-02 ENCOUNTER — Ambulatory Visit (INDEPENDENT_AMBULATORY_CARE_PROVIDER_SITE_OTHER): Payer: 59 | Admitting: Internal Medicine

## 2013-11-02 VITALS — BP 126/82 | HR 68 | Ht 66.25 in | Wt 178.1 lb

## 2013-11-02 DIAGNOSIS — K589 Irritable bowel syndrome without diarrhea: Secondary | ICD-10-CM

## 2013-11-02 MED ORDER — GLYCOPYRROLATE 2 MG PO TABS: 2.0000 mg | ORAL_TABLET | Freq: Two times a day (BID) | ORAL | Status: DC

## 2013-11-02 NOTE — Telephone Encounter (Signed)
We can increase the gabapentin to  at bedtime.  It looks like I am supposed to see her on Oct 12.  Since it has been awhile, I would like to re-evaluate her again before ordering the EMG.  I have the notes from her neurologist from [place], but I don't have any MRI reports.

## 2013-11-02 NOTE — Progress Notes (Signed)
Kathleen Weber [birth year] [id number]  Note: This dictation was prepared with Dragon digital system. Any transcriptional errors that result from this procedure are unintentional.   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 She has been much improved in terms ofnot having fecal urgency and diarrhea. Since the school started she has had several stressful episodes resulting in diarrhea but overall she feels improved. She associates her diarrhea with stress. Colonoscopy in June 2014 was normal. She has declined use of SSRIs or tranquilizers but she has started stress management counseling.    Past Medical History  Diagnosis Date  . Hypertension   . Thyroid nodule   . Neuropathy   . Arthritis   . Chicken pox   . Gastritis     Past Surgical History  Procedure Laterality Date  . Neg hx      Allergies  Allergen Reactions  . Codeine Other (See Comments)    Hot flash, nausea, feels faint    Family history and social history have been reviewed.  Review of Systems: Denies heartburn dysphagia rectal bleeding  The remainder of the 10 point ROS is negative except as outlined in the H&P  Physical Exam: General Appearance Well developed, in no distress Eyes  Non icteric  HEENT  Non traumatic, normocephalic  Mouth No lesion, tongue papillated, no cheilosis Neck Supple without adenopathy, thyroid not enlarged, no carotid bruits, no JVD Lungs Clear to auscultation bilaterally COR Normal S1, normal S2, regular rhythm, no murmur, quiet precordium Abdomen soft nontender abdomen with normoactive bowel sounds. No distention. Liver edge at costal margin Rectal not done Extremities  No pedal edema Skin No lesions Neurological Alert and oriented x 3 Psychological Normal mood  and affect  Assessment and Plan:   68s-year-old white female with irritable bowel syndrome which has been under reasonable control with high fiber diet, fiber supplements and antispasmodic Robinul Forte 2 mg twice a day. She will continue the same regimen and will follow up on when necessary basis.    Lina Sar 11/02/2013

## 2013-11-02 NOTE — Patient Instructions (Addendum)
We have sent the following medications to your pharmacy for you to pick up at your convenience: Robinul Forte 2 mg twice daily  Please take benefiber daily.  You will be due for a recall colonoscopy in 07/2022. We will send you a reminder in the mail when it gets closer to that time.  Dr Felicity Coyer

## 2013-11-03 ENCOUNTER — Ambulatory Visit: Payer: 59 | Admitting: Internal Medicine

## 2013-11-06 NOTE — Telephone Encounter (Signed)
Pt called this morning, says she is awaiting a return call. Please call back @ [phone] / Sherri S.

## 2013-11-06 NOTE — Telephone Encounter (Signed)
Patient is aware she can increase  Gabapentin to  at [facility]  Number for medical records is [phone]

## 2013-11-20 ENCOUNTER — Telehealth: Payer: Self-pay | Admitting: Neurology

## 2013-11-20 ENCOUNTER — Encounter: Payer: Self-pay | Admitting: Neurology

## 2013-11-20 ENCOUNTER — Ambulatory Visit (INDEPENDENT_AMBULATORY_CARE_PROVIDER_SITE_OTHER): Payer: 59 | Admitting: Neurology

## 2013-11-20 VITALS — BP 120/78 | HR 83 | Ht 66.0 in | Wt 174.0 lb

## 2013-11-20 DIAGNOSIS — G5601 Carpal tunnel syndrome, right upper limb: Secondary | ICD-10-CM

## 2013-11-20 DIAGNOSIS — M5010 Cervical disc disorder with radiculopathy, unspecified cervical region: Secondary | ICD-10-CM

## 2013-11-20 DIAGNOSIS — G5603 Carpal tunnel syndrome, bilateral upper limbs: Secondary | ICD-10-CM

## 2013-11-20 DIAGNOSIS — M5417 Radiculopathy, lumbosacral region: Secondary | ICD-10-CM

## 2013-11-20 DIAGNOSIS — G5602 Carpal tunnel syndrome, left upper limb: Secondary | ICD-10-CM

## 2013-11-20 MED ORDER — GABAPENTIN 100 MG PO CAPS: 200.0000 mg | ORAL_CAPSULE | ORAL | Status: DC | PRN

## 2013-11-20 NOTE — Telephone Encounter (Signed)
Pt called wanting to f/u and see if her MRI results were sent in. She had a MRI in [place]. Pt's PCP has her MRI resultsas well. Dr. Rene PaciValerie Leschber is her PCP. C/B [phone]

## 2013-11-20 NOTE — Patient Instructions (Signed)
1.  Try cutting back the gabapentin to 100mg  at bedtime again and see if the leg pain worsens.  If it does, then go back to 200mg . 2.  We will schedule NCV-EMG of both upper extremities to evaluate for bilateral hand pain and numbness 3.  We will try and schedule MRI of the lumbar spine without contrast to look for structural cause for the left leg pain. 4.  Follow up right after the EMG.

## 2013-11-20 NOTE — Telephone Encounter (Signed)
Susie - I see where you talked with patient about this before. Please advise as I don't know if this was received since you spoke with the patient.
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
## 2013-11-23 NOTE — Telephone Encounter (Signed)
Patient is aware that notes were received

## 2013-12-04 ENCOUNTER — Ambulatory Visit (HOSPITAL_COMMUNITY): Admission: RE | Admit: 2013-12-04 | Payer: 59 | Source: Ambulatory Visit

## 2013-12-08 ENCOUNTER — Ambulatory Visit (HOSPITAL_COMMUNITY)
Admission: RE | Admit: 2013-12-08 | Discharge: 2013-12-08 | Disposition: A | Discharge status: Home / Self Care | Payer: 59 | Source: Ambulatory Visit | Attending: Neurology | Admitting: Neurology

## 2013-12-08 DIAGNOSIS — M5417 Radiculopathy, lumbosacral region: Secondary | ICD-10-CM

## 2013-12-08 DIAGNOSIS — M4806 Spinal stenosis, lumbar region: Secondary | ICD-10-CM | POA: Insufficient documentation

## 2013-12-08 DIAGNOSIS — M5010 Cervical disc disorder with radiculopathy, unspecified cervical region: Secondary | ICD-10-CM

## 2013-12-08 DIAGNOSIS — M5146 Schmorl's nodes, lumbar region: Secondary | ICD-10-CM | POA: Insufficient documentation

## 2013-12-08 DIAGNOSIS — M5126 Other intervertebral disc displacement, lumbar region: Secondary | ICD-10-CM | POA: Diagnosis not present

## 2013-12-08 DIAGNOSIS — G5603 Carpal tunnel syndrome, bilateral upper limbs: Secondary | ICD-10-CM

## 2013-12-08 DIAGNOSIS — M5136 Other intervertebral disc degeneration, lumbar region: Secondary | ICD-10-CM | POA: Insufficient documentation

## 2013-12-12 ENCOUNTER — Telehealth: Payer: Self-pay | Admitting: Neurology

## 2013-12-12 NOTE — Telephone Encounter (Signed)
Discussed MRI results with patient.  It does reveal spinal stenosis with evidence of radiculopathy.  At this time, she wishes to continue conservative management with gabapentin.  The leg pain is better.  It only occurs occasionally and very briefly.  I told her she can go up to 300mg  if needed.

## 2014-01-03 ENCOUNTER — Ambulatory Visit (INDEPENDENT_AMBULATORY_CARE_PROVIDER_SITE_OTHER): Payer: 59 | Admitting: Neurology

## 2014-01-03 DIAGNOSIS — G5603 Carpal tunnel syndrome, bilateral upper limbs: Secondary | ICD-10-CM

## 2014-01-03 DIAGNOSIS — G5602 Carpal tunnel syndrome, left upper limb: Secondary | ICD-10-CM

## 2014-01-03 DIAGNOSIS — G5601 Carpal tunnel syndrome, right upper limb: Secondary | ICD-10-CM
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
## 2014-01-08 ENCOUNTER — Ambulatory Visit: Payer: 59 | Admitting: Neurology

## 2014-01-08 ENCOUNTER — Ambulatory Visit (INDEPENDENT_AMBULATORY_CARE_PROVIDER_SITE_OTHER): Payer: 59 | Admitting: Neurology

## 2014-01-08 ENCOUNTER — Encounter: Payer: Self-pay | Admitting: Neurology

## 2014-01-08 VITALS — BP 100/78 | HR 76 | Ht 66.0 in | Wt 186.0 lb

## 2014-01-08 DIAGNOSIS — G5602 Carpal tunnel syndrome, left upper limb: Secondary | ICD-10-CM

## 2014-01-08 DIAGNOSIS — G5603 Carpal tunnel syndrome, bilateral upper limbs: Secondary | ICD-10-CM

## 2014-01-08 DIAGNOSIS — G5601 Carpal tunnel syndrome, right upper limb: Secondary | ICD-10-CM

## 2014-01-08 DIAGNOSIS — M5417 Radiculopathy, lumbosacral region: Secondary | ICD-10-CM
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
## 2014-01-08 NOTE — Patient Instructions (Signed)
1.  Continue gabapentin 2.  Follow up in 6 months or as needed.

## 2014-01-17 ENCOUNTER — Other Ambulatory Visit: Payer: Self-pay | Admitting: Internal Medicine

## 2014-06-22 ENCOUNTER — Telehealth: Payer: Self-pay | Admitting: Internal Medicine

## 2014-06-22 NOTE — Telephone Encounter (Signed)
Patient is requesting a script for hydrocodone cough syrup.  She states she has post nasal drip and during the allergy season it helps her sleep.

## 2014-06-22 NOTE — Telephone Encounter (Signed)
Pt informed of MD response.  

## 2014-06-22 NOTE — Telephone Encounter (Signed)
Very sorry, but this is usually not done for this purpose, as this is a controlled substance with the potential for tolerance (so it doesn't work later) and even dependence.   OK to try Delsym OTC, and I would encourage focus on treatment of the allergies with an OTC antihistamine such as zyrtec and a nasal spray such as nasacort

## 2014-07-11 ENCOUNTER — Ambulatory Visit: Payer: 59 | Admitting: Neurology

## 2014-07-11 ENCOUNTER — Encounter: Payer: Self-pay | Admitting: Neurology

## 2014-07-11 DIAGNOSIS — Z029 Encounter for administrative examinations, unspecified: Secondary | ICD-10-CM

## 2014-08-08 ENCOUNTER — Encounter: Payer: Self-pay | Admitting: Internal Medicine

## 2014-08-08 ENCOUNTER — Ambulatory Visit (INDEPENDENT_AMBULATORY_CARE_PROVIDER_SITE_OTHER): Payer: 59 | Admitting: Internal Medicine

## 2014-08-08 VITALS — BP 130/88 | HR 88 | Temp 98.2°F | Resp 18 | Wt 181.0 lb

## 2014-08-08 DIAGNOSIS — Z87898 Personal history of other specified conditions: Secondary | ICD-10-CM | POA: Diagnosis not present

## 2014-08-08 DIAGNOSIS — Z86018 Personal history of other benign neoplasm: Secondary | ICD-10-CM

## 2014-08-08 NOTE — Progress Notes (Signed)
   Subjective:    Patient ID: Kathleen Weber, female    DOB: 07/23/1960, 54 y.o.   MRN: [id number]  HPI  Her daughter took a picture of her arm recently which suggested a mass at the lateral elbow. It was asymptomatic in that there was no pain. There was also no change in color or temperature of the skin in this area.  Her concern is her past history of breast cysts as well as ovarian cysts. 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 Surgical resection was deferred and there's been no progression.  Her father also tended to have cyst formation.  Problem list includes thyroid nodule.  She has no history of melanoma or  skin cancer. Her colonoscopy is up-to-date.  Review of Systems  She has no fever, chills, sweats, or change in weight       Objective:   Physical Exam  Pertinent or positive findings include: No cystic lesions could be palpated in the left upper extremity. The lateral aspect of the olecranon is prominent with inward rotation of the left upper extremity. She has no epitrochlear, axillary, or cervical lymphadenopathy. She also has no hepatomegaly or splenomegaly. Thyroid appears normal to palpation; I cannot appreciate any nodularity.  General appearance :adequately nourished; in no distress.  Eyes: No conjunctival inflammation or scleral icterus is present.  Oral exam:  Lips and gums are healthy appearing.There is no oropharyngeal erythema or exudate noted. Dental hygiene is good.  Heart:  Normal rate and regular rhythm. S1 and S2 normal without gallop, murmur, click, rub or other extra sounds    Lungs:Chest clear to auscultation; no wheezes, rhonchi,rales ,or rubs present.No increased work of breathing.   Abdomen: bowel sounds normal, soft and non-tender without masses, organomegaly or hernias noted.  No guarding or rebound.   Vascular : all pulses equal ; no bruits present.  Skin:Warm & dry.   Intact without suspicious lesions or rashes ; no tenting or jaundice   Neuro: Strength, tone & DTRs normal.        Assessment & Plan:  #1 possible lipoma; I cannot localize such at this time.  Plan: I reassured her that no intervention is needed at this time.

## 2014-08-08 NOTE — Patient Instructions (Signed)
Please report any symptoms of fever, chills, sweats, weight loss. Please report any new skin lesions. At this time I see no evidence of any pathological lesions of concern as we discussed.

## 2014-08-08 NOTE — Progress Notes (Signed)
Pre visit review using our clinic review tool, if applicable. No additional management support is needed unless otherwise documented below in the visit note. 

## 2014-10-18 ENCOUNTER — Other Ambulatory Visit: Payer: Self-pay

## 2014-10-18 MED ORDER — VALSARTAN 320 MG PO TABS: 320.0000 mg | ORAL_TABLET | Freq: Every day | ORAL | Status: DC

## 2014-10-18 MED ORDER — AMLODIPINE BESYLATE 5 MG PO TABS: 5.0000 mg | ORAL_TABLET | Freq: Every day | ORAL | Status: DC

## 2014-10-18 MED ORDER — CYCLOBENZAPRINE HCL 5 MG PO TABS: 5.0000 mg | ORAL_TABLET | Freq: Every evening | ORAL | Status: DC | PRN

## 2014-11-07 ENCOUNTER — Other Ambulatory Visit: Payer: Self-pay | Admitting: Obstetrics and Gynecology

## 2014-11-09 LAB — CYTOLOGY - PAP

## 2014-12-04 ENCOUNTER — Telehealth: Payer: Self-pay | Admitting: Internal Medicine

## 2014-12-04 DIAGNOSIS — Z0000 Encounter for general adult medical examination without abnormal findings: Secondary | ICD-10-CM

## 2014-12-04 DIAGNOSIS — E611 Iron deficiency: Secondary | ICD-10-CM

## 2014-12-04 NOTE — Telephone Encounter (Signed)
Patient requests that CPE lab orders be placed so that she can go before appt. Because of work, she needs to be out ASAP tomorrow

## 2014-12-04 NOTE — Telephone Encounter (Signed)
Ferritin is sufficient to screen for iron deficiency. Order associated with diagnosis and signed

## 2014-12-04 NOTE — Telephone Encounter (Signed)
Order entered.   Will you verify the name of the iron test.

## 2014-12-04 NOTE — Telephone Encounter (Signed)
She also states that she wants to make sure that we put in orders to test for iron, cholesterol and diabetes since we forgot to do it last year.

## 2014-12-05 ENCOUNTER — Other Ambulatory Visit (INDEPENDENT_AMBULATORY_CARE_PROVIDER_SITE_OTHER): Payer: 59

## 2014-12-05 ENCOUNTER — Encounter: Payer: Self-pay | Admitting: Internal Medicine

## 2014-12-05 ENCOUNTER — Ambulatory Visit (INDEPENDENT_AMBULATORY_CARE_PROVIDER_SITE_OTHER): Payer: 59 | Admitting: Internal Medicine

## 2014-12-05 VITALS — BP 104/78 | HR 77 | Temp 98.2°F | Ht 66.0 in | Wt 180.8 lb

## 2014-12-05 DIAGNOSIS — Z0000 Encounter for general adult medical examination without abnormal findings: Secondary | ICD-10-CM

## 2014-12-05 DIAGNOSIS — I10 Essential (primary) hypertension: Secondary | ICD-10-CM | POA: Diagnosis not present

## 2014-12-05 DIAGNOSIS — E611 Iron deficiency: Secondary | ICD-10-CM

## 2014-12-05 LAB — HEPATIC FUNCTION PANEL
ALBUMIN: 4.3 g/dL (ref 3.5–5.2)
ALK PHOS: 65 U/L (ref 39–117)
ALT: 24 U/L (ref 0–35)
AST: 28 U/L (ref 0–37)
Bilirubin, Direct: 0.1 mg/dL (ref 0.0–0.3)
TOTAL PROTEIN: 7.7 g/dL (ref 6.0–8.3)
Total Bilirubin: 0.7 mg/dL (ref 0.2–1.2)

## 2014-12-05 LAB — CBC WITH DIFFERENTIAL/PLATELET
BASOS ABS: 0 10*3/uL (ref 0.0–0.1)
Basophils Relative: 0.8 % (ref 0.0–3.0)
Eosinophils Absolute: 0.1 10*3/uL (ref 0.0–0.7)
Eosinophils Relative: 1.4 % (ref 0.0–5.0)
HEMATOCRIT: 45 % (ref 36.0–46.0)
HEMOGLOBIN: 15.3 g/dL — AB (ref 12.0–15.0)
LYMPHS PCT: 37.5 % (ref 12.0–46.0)
Lymphs Abs: 1.8 10*3/uL (ref 0.7–4.0)
MCHC: 34 g/dL (ref 30.0–36.0)
MCV: 85.1 fl (ref 78.0–100.0)
MONOS PCT: 8.7 % (ref 3.0–12.0)
Monocytes Absolute: 0.4 10*3/uL (ref 0.1–1.0)
Neutro Abs: 2.5 10*3/uL (ref 1.4–7.7)
Neutrophils Relative %: 51.6 % (ref 43.0–77.0)
Platelets: 205 10*3/uL (ref 150.0–400.0)
RBC: 5.28 Mil/uL — AB (ref 3.87–5.11)
RDW: 12.2 % (ref 11.5–15.5)
WBC: 4.9 10*3/uL (ref 4.0–10.5)

## 2014-12-05 LAB — URINALYSIS, ROUTINE W REFLEX MICROSCOPIC
Bilirubin Urine: NEGATIVE
Ketones, ur: NEGATIVE
NITRITE: NEGATIVE
PH: 7.5 (ref 5.0–8.0)
Specific Gravity, Urine: 1.01 (ref 1.000–1.030)
TOTAL PROTEIN, URINE-UPE24: NEGATIVE
Urine Glucose: NEGATIVE
Urobilinogen, UA: 0.2 (ref 0.0–1.0)

## 2014-12-05 LAB — BASIC METABOLIC PANEL
BUN: 13 mg/dL (ref 6–23)
CHLORIDE: 100 meq/L (ref 96–112)
CO2: 33 meq/L — AB (ref 19–32)
CREATININE: 0.77 mg/dL (ref 0.40–1.20)
Calcium: 9.6 mg/dL (ref 8.4–10.5)
GFR: 82.74 mL/min (ref >60.00)
Glucose, Bld: 103 mg/dL — ABNORMAL HIGH (ref 70–99)
Potassium: 3.6 mEq/L (ref 3.5–5.1)
Sodium: 141 mEq/L (ref 135–145)

## 2014-12-05 LAB — TSH: TSH: 0.94 u[IU]/mL (ref 0.35–4.50)

## 2014-12-05 LAB — LIPID PANEL
Cholesterol: 206 mg/dL — ABNORMAL HIGH (ref 0–200)
HDL: 50.7 mg/dL (ref >39.00)
LDL Cholesterol: 135 mg/dL — ABNORMAL HIGH (ref 0–99)
NonHDL: 155.72
Total CHOL/HDL Ratio: 4
Triglycerides: 105 mg/dL (ref 0.0–149.0)
VLDL: 21 mg/dL (ref 0.0–40.0)

## 2014-12-05 LAB — FERRITIN: FERRITIN: 178.2 ng/mL (ref 10.0–291.0)

## 2014-12-05 MED ORDER — HYDROCOD POLST-CPM POLST ER 10-8 MG/5ML PO SUER: 5.0000 mL | Freq: Two times a day (BID) | ORAL | Status: DC | PRN

## 2014-12-05 NOTE — Progress Notes (Signed)
Pre visit review using our clinic review tool, if applicable. No additional management support is needed unless otherwise documented below in the visit note. 

## 2014-12-05 NOTE — Patient Instructions (Addendum)
It was good to see you today.  We have reviewed your prior records including labs and tests today  Health Maintenance reviewed - all recommended immunizations and age-appropriate screenings are up-to-date.  Test(s) ordered today. Your results will be released to [place] (or called to you) after review, usually within 72hours after test completion. If any changes need to be made, you will be notified at that same time.  Medications reviewed and updated, no changes recommended at this time.  Monitor home blood pressure trends and associated dietary, exercise, stress changes that may be related to same. Please contact us if consistently greater than 140/90 or if increasing headache symptoms/other problems for potential medication changes as discussed  Please schedule followup in 12 months for annual exam and labs, call sooner if problems.  Health Maintenance, Female Adopting a healthy lifestyle and getting preventive care can go a long way to promote health and wellness. Talk with your health care provider about what schedule of regular examinations is right for you. This is a good chance for you to check in with your provider about disease prevention and staying healthy. In between checkups, there are plenty of things you can do on your own. Experts have done a lot of research about which lifestyle changes and preventive measures are most likely to keep you healthy. Ask your health care provider for more information. WEIGHT AND DIET  Eat a healthy diet  Be sure to include plenty of vegetables, fruits, low-fat dairy products, and lean protein.  Do not eat a lot of foods high in solid fats, added sugars, or salt.  Get regular exercise. This is one of the most important things you can do for your health.  Most adults should exercise for at least 150 minutes each week. The exercise should increase your heart rate and make you sweat (moderate-intensity exercise).  Most adults should also do  strengthening exercises at least twice a week. This is in addition to the moderate-intensity exercise.  Maintain a healthy weight  Body mass index (BMI) is a measurement that can be used to identify possible weight problems. It estimates body fat based on height and weight. Your health care provider can help determine your BMI and help you achieve or maintain a healthy weight.  For females 72 years of age and older:   A BMI below 18.5 is considered underweight.  A BMI of 18.5 to 24.9 is normal.  A BMI of 25 to 29.9 is considered overweight.  A BMI of 30 and above is considered obese.  Watch levels of cholesterol and blood lipids  You should start having your blood tested for lipids and cholesterol at 55 years of age, then have this test every 5 years.  You may need to have your cholesterol levels checked more often if:  Your lipid or cholesterol levels are high.  You are older than 55 years of age.  You are at high risk for heart disease.  CANCER SCREENING   Lung Cancer  Lung cancer screening is recommended for adults 71-28 years old who are at high risk for lung cancer because of a history of smoking.  A yearly low-dose CT scan of the lungs is recommended for people who:  Currently smoke.  Have quit within the past 15 years.  Have at least a 30-pack-year history of smoking. A pack year is smoking an average of one pack of cigarettes a day for 1 year.  Yearly screening should continue until it has been 15 years  since you quit.  Yearly screening should stop if you develop a health problem that would prevent you from having lung cancer treatment.  Breast Cancer  Practice breast self-awareness. This means understanding how your breasts normally appear and feel.  It also means doing regular breast self-exams. Let your health care provider know about any changes, no matter how small.  If you are in your 20s or 30s, you should have a clinical breast exam (CBE) by a  health care provider every 1-3 years as part of a regular health exam.  If you are 54 or older, have a CBE every year. Also consider having a breast X-ray (mammogram) every year.  If you have a family history of breast cancer, talk to your health care provider about genetic screening.  If you are at high risk for breast cancer, talk to your health care provider about having an MRI and a mammogram every year.  Breast cancer gene (BRCA) assessment is recommended for women who have family members with BRCA-related cancers. BRCA-related cancers include:  Breast.  Ovarian.  Tubal.  Peritoneal cancers.  Results of the assessment will determine the need for genetic counseling and BRCA1 and BRCA2 testing. Cervical Cancer Your health care provider may recommend that you be screened regularly for cancer of the pelvic organs (ovaries, uterus, and vagina). This screening involves a pelvic examination, including checking for microscopic changes to the surface of your cervix (Pap test). You may be encouraged to have this screening done every 3 years, beginning at age 20.  For women ages 8-65, health care providers may recommend pelvic exams and Pap testing every 3 years, or they may recommend the Pap and pelvic exam, combined with testing for human papilloma virus (HPV), every 5 years. Some types of HPV increase your risk of cervical cancer. Testing for HPV may also be done on women of any age with unclear Pap test results.  Other health care providers may not recommend any screening for nonpregnant women who are considered low risk for pelvic cancer and who do not have symptoms. Ask your health care provider if a screening pelvic exam is right for you.  If you have had past treatment for cervical cancer or a condition that could lead to cancer, you need Pap tests and screening for cancer for at least 20 years after your treatment. If Pap tests have been discontinued, your risk factors (such as having a  new sexual partner) need to be reassessed to determine if screening should resume. Some women have medical problems that increase the chance of getting cervical cancer. In these cases, your health care provider may recommend more frequent screening and Pap tests. Colorectal Cancer  This type of cancer can be detected and often prevented.  Routine colorectal cancer screening usually begins at 55 years of age and continues through 55 years of age.  Your health care provider may recommend screening at an earlier age if you have risk factors for colon cancer.  Your health care provider may also recommend using home test kits to check for hidden blood in the stool.  A small camera at the end of a tube can be used to examine your colon directly (sigmoidoscopy or colonoscopy). This is done to check for the earliest forms of colorectal cancer.  Routine screening usually begins at age 52.  Direct examination of the colon should be repeated every 5-10 years through 55 years of age. However, you may need to be screened more often if early forms  of precancerous polyps or small growths are found. Skin Cancer  Check your skin from head to toe regularly.  Tell your health care provider about any new moles or changes in moles, especially if there is a change in a mole's shape or color.  Also tell your health care provider if you have a mole that is larger than the size of a pencil eraser.  Always use sunscreen. Apply sunscreen liberally and repeatedly throughout the day.  Protect yourself by wearing long sleeves, pants, a wide-brimmed hat, and sunglasses whenever you are outside. HEART DISEASE, DIABETES, AND HIGH BLOOD PRESSURE   High blood pressure causes heart disease and increases the risk of stroke. High blood pressure is more likely to develop in:  People who have blood pressure in the high end of the normal range (130-139/85-89 mm Hg).  People who are overweight or obese.  People who are  African American.  If you are 28-49 years of age, have your blood pressure checked every 3-5 years. If you are 52 years of age or older, have your blood pressure checked every year. You should have your blood pressure measured twice--once when you are at a hospital or clinic, and once when you are not at a hospital or clinic. Record the average of the two measurements. To check your blood pressure when you are not at a hospital or clinic, you can use:  An automated blood pressure machine at a pharmacy.  A home blood pressure monitor.  If you are between 62 years and 19 years old, ask your health care provider if you should take aspirin to prevent strokes.  Have regular diabetes screenings. This involves taking a blood sample to check your fasting blood sugar level.  If you are at a normal weight and have a low risk for diabetes, have this test once every three years after 55 years of age.  If you are overweight and have a high risk for diabetes, consider being tested at a younger age or more often. PREVENTING INFECTION  Hepatitis B  If you have a higher risk for hepatitis B, you should be screened for this virus. You are considered at high risk for hepatitis B if:  You were born in a country where hepatitis B is common. Ask your health care provider which countries are considered high risk.  Your parents were born in a high-risk country, and you have not been immunized against hepatitis B (hepatitis B vaccine).  You have HIV or AIDS.  You use needles to inject street drugs.  You live with someone who has hepatitis B.  You have had sex with someone who has hepatitis B.  You get hemodialysis treatment.  You take certain medicines for conditions, including cancer, organ transplantation, and autoimmune conditions. Hepatitis C  Blood testing is recommended for:  Everyone born from [birth year] through 1966.  Anyone with known risk factors for hepatitis C. Sexually transmitted infections  (STIs)  You should be screened for sexually transmitted infections (STIs) including gonorrhea and chlamydia if:  You are sexually active and are younger than 55 years of age.  You are older than 55 years of age and your health care provider tells you that you are at risk for this type of infection.  Your sexual activity has changed since you were last screened and you are at an increased risk for chlamydia or gonorrhea. Ask your health care provider if you are at risk.  If you do not have HIV, but are at  risk, it may be recommended that you take a prescription medicine daily to prevent HIV infection. This is called pre-exposure prophylaxis (PrEP). You are considered at risk if:  You are sexually active and do not regularly use condoms or know the HIV status of your partner(s).  You take drugs by injection.  You are sexually active with a partner who has HIV. Talk with your health care provider about whether you are at high risk of being infected with HIV. If you choose to begin PrEP, you should first be tested for HIV. You should then be tested every 3 months for as long as you are taking PrEP.  PREGNANCY   If you are premenopausal and you may become pregnant, ask your health care provider about preconception counseling.  If you may become pregnant, take 400 to 800 micrograms (mcg) of folic acid every day.  If you want to prevent pregnancy, talk to your health care provider about birth control (contraception). OSTEOPOROSIS AND MENOPAUSE   Osteoporosis is a disease in which the bones lose minerals and strength with aging. This can result in serious bone fractures. Your risk for osteoporosis can be identified using a bone density scan.  If you are 26 years of age or older, or if you are at risk for osteoporosis and fractures, ask your health care provider if you should be screened.  Ask your health care provider whether you should take a calcium or vitamin D supplement to lower your risk  for osteoporosis.  Menopause may have certain physical symptoms and risks.  Hormone replacement therapy may reduce some of these symptoms and risks. Talk to your health care provider about whether hormone replacement therapy is right for you.  HOME CARE INSTRUCTIONS   Schedule regular health, dental, and eye exams.  Stay current with your immunizations.   Do not use any tobacco products including cigarettes, chewing tobacco, or electronic cigarettes.  If you are pregnant, do not drink alcohol.  If you are breastfeeding, limit how much and how often you drink alcohol.  Limit alcohol intake to no more than 1 drink per day for nonpregnant women. One drink equals 12 ounces of beer, 5 ounces of wine, or 1 ounces of hard liquor.  Do not use street drugs.  Do not share needles.  Ask your health care provider for help if you need support or information about quitting drugs.  Tell your health care provider if you often feel depressed.  Tell your health care provider if you have ever been abused or do not feel safe at home.   This information is not intended to replace advice given to you by your health care provider. Make sure you discuss any questions you have with your health care provider.   Document Released: 08/11/2010 Document Revised: 02/16/2014 Document Reviewed: 12/28/2012 Elsevier Interactive Patient Education 2016 [place] DASH stands for "Dietary Approaches to Stop Hypertension." The DASH eating plan is a healthy eating plan that has been shown to reduce high blood pressure (hypertension). Additional health benefits may include reducing the risk of type 2 diabetes mellitus, heart disease, and stroke. The DASH eating plan may also help with weight loss. WHAT DO I NEED TO KNOW ABOUT THE DASH EATING PLAN? For the DASH eating plan, you will follow these general guidelines:  Choose foods with a percent daily value for sodium of less than 5% (as listed on  the food label).  Use salt-free seasonings or herbs instead of table salt or  sea salt.  Check with your health care provider or pharmacist before using salt substitutes.  Eat lower-sodium products, often labeled as "lower sodium" or "no salt added."  Eat fresh foods.  Eat more vegetables, fruits, and low-fat dairy products.  Choose whole grains. Look for the word "whole" as the first word in the ingredient list.  Choose fish and skinless chicken or [place] more often than red meat. Limit fish, poultry, and meat to 6 oz (170 g) each day.  Limit sweets, desserts, sugars, and sugary drinks.  Choose heart-healthy fats.  Limit cheese to 1 oz (28 g) per day.  Eat more home-cooked food and less restaurant, buffet, and fast food.  Limit fried foods.  Cook foods using methods other than frying.  Limit canned vegetables. If you do use them, rinse them well to decrease the sodium.  When eating at a restaurant, ask that your food be prepared with less salt, or no salt if possible. WHAT FOODS CAN I EAT? Seek help from a dietitian for individual calorie needs. Grains Whole grain or whole wheat bread. Brown rice. Whole grain or whole wheat pasta. Quinoa, bulgur, and whole grain cereals. Low-sodium cereals. Corn or whole wheat flour tortillas. Whole grain cornbread. Whole grain crackers. Low-sodium crackers. Vegetables Fresh or frozen vegetables (raw, steamed, roasted, or grilled). Low-sodium or reduced-sodium tomato and vegetable juices. Low-sodium or reduced-sodium tomato sauce and paste. Low-sodium or reduced-sodium canned vegetables.  Fruits All fresh, canned (in natural juice), or frozen fruits. Meat and Other Protein Products Ground beef (85% or leaner), grass-fed beef, or beef trimmed of fat. Skinless chicken or [place]. Ground chicken or [place]. Pork trimmed of fat. All fish and seafood. Eggs. Dried beans, peas, or lentils. Unsalted nuts and seeds. Unsalted canned beans. Dairy Low-fat  dairy products, such as skim or 1% milk, 2% or reduced-fat cheeses, low-fat ricotta or cottage cheese, or plain low-fat yogurt. Low-sodium or reduced-sodium cheeses. Fats and Oils Tub margarines without trans fats. Light or reduced-fat mayonnaise and salad dressings (reduced sodium). Avocado. Safflower, olive, or canola oils. Natural peanut or almond butter. Other Unsalted popcorn and pretzels. The items listed above may not be a complete list of recommended foods or beverages. Contact your dietitian for more options. WHAT FOODS ARE NOT RECOMMENDED? Grains White bread. White pasta. White rice. Refined cornbread. Bagels and croissants. Crackers that contain trans fat. Vegetables Creamed or fried vegetables. Vegetables in a cheese sauce. Regular canned vegetables. Regular canned tomato sauce and paste. Regular tomato and vegetable juices. Fruits Dried fruits. Canned fruit in light or heavy syrup. Fruit juice. Meat and Other Protein Products Fatty cuts of meat. Ribs, chicken wings, bacon, sausage, bologna, salami, chitterlings, fatback, hot dogs, bratwurst, and packaged luncheon meats. Salted nuts and seeds. Canned beans with salt. Dairy Whole or 2% milk, cream, half-and-half, and cream cheese. Whole-fat or sweetened yogurt. Full-fat cheeses or blue cheese. Nondairy creamers and whipped toppings. Processed cheese, cheese spreads, or cheese curds. Condiments Onion and garlic salt, seasoned salt, table salt, and sea salt. Canned and packaged gravies. Worcestershire sauce. Tartar sauce. Barbecue sauce. Teriyaki sauce. Soy sauce, including reduced sodium. Steak sauce. Fish sauce. Oyster sauce. Cocktail sauce. Horseradish. Ketchup and mustard. Meat flavorings and tenderizers. Bouillon cubes. Hot sauce. Tabasco sauce. Marinades. Taco seasonings. Relishes. Fats and Oils Butter, stick margarine, lard, shortening, ghee, and bacon fat. Coconut, palm kernel, or palm oils. Regular salad  dressings. Other Pickles and olives. Salted popcorn and pretzels. The items listed above may not be a complete  list of foods and beverages to avoid. Contact your dietitian for more information. WHERE CAN I FIND MORE INFORMATION? National Heart, Lung, and Blood Institute: travelstabloid.com   This information is not intended to replace advice given to you by your health care provider. Make sure you discuss any questions you have with your health care provider.   Document Released: 01/15/2011 Document Revised: 02/16/2014 Document Reviewed: 11/30/2012 Elsevier Interactive Patient Education [employer].

## 2014-12-05 NOTE — Assessment & Plan Note (Signed)
BP Readings from Last 3 Encounters:  12/05/14 104/78  08/08/14 130/88  01/08/14 100/78   reviewed home trends with evening blood pressure running higher (140s/90s) [place], when initiating med treatment for same years ago, pt determined to be intolerant of diuretic and overly sensitive to amlodipine dose > 5 mg Taking maximized ARB dose in morning and amlodipine 5mg  at bedtime Patient will keep home log of trends, potential correlation with salt in diet or other stressors reviewed If consistently greater than 140/90, consider increase amlodipine to 7.5 mg versus other therapies. Patient will email us with her findings -no specific changes recommended today

## 2014-12-05 NOTE — Progress Notes (Signed)
Subjective:    Patient ID: Kathleen Weber, female    DOB: 07-11-1960, 54 y.o.   MRN: [id number]  HPI  patient is here today for annual physical. Patient feels well in general Also reviewed chronic medical conditions, interval events and current concerns  Past Medical History  Diagnosis Date  . Hypertension   . Thyroid nodule   . Neuropathy (HCC)   . Arthritis   . Chicken pox   . Gastritis    Family History  Problem Relation Age of Onset  . Stomach cancer Mother   . Diabetes Father   . Heart disease Mother   . Coronary artery disease Father [id number]    CABGx5  . Kidney disease Mother     kidney cancer  . Lymphoma Maternal Grandmother   . Stroke Father     TIAs  . Hypertension Mother   . Hypertension Father    Social History  Substance Use Topics  . Smoking status: Never Smoker   . Smokeless tobacco: Never Used  . Alcohol Use: No    Review of Systems  Constitutional: Negative for fatigue and unexpected weight change.  Respiratory: Negative for cough, shortness of breath and wheezing.   Cardiovascular: Negative for chest pain, palpitations and leg swelling.  Gastrointestinal: Negative for nausea, abdominal pain and diarrhea.  Neurological: Negative for dizziness, weakness, light-headedness and headaches.  Psychiatric/Behavioral: Negative for dysphoric mood. The patient is not nervous/anxious.   All other systems reviewed and are negative.      Objective:    Physical Exam  Constitutional: She is oriented to person, place, and time. She appears well-developed and well-nourished. No distress.  HENT:  Head: Normocephalic and atraumatic.  Right Ear: External ear normal.  Left Ear: External ear normal.  Nose: Nose normal.  Mouth/Throat: Oropharynx is clear and moist. No oropharyngeal exudate.  Eyes: EOM are normal. Pupils are equal, round, and reactive to light. Right eye exhibits no discharge. Left eye exhibits no discharge. No scleral icterus.  Neck: Normal  range of motion. Neck supple. No JVD present. No tracheal deviation present. No thyromegaly present.  Cardiovascular: Normal rate, regular rhythm, normal heart sounds and intact distal pulses.  Exam reveals no friction rub.   No murmur heard. Pulmonary/Chest: Effort normal and breath sounds normal. No respiratory distress. She has no wheezes. She has no rales. She exhibits no tenderness.  Abdominal: Soft. Bowel sounds are normal. She exhibits no distension and no mass. There is no tenderness. There is no rebound and no guarding.  Genitourinary:  Defer to gyn  Musculoskeletal: Normal range of motion.  No gross deformities  Lymphadenopathy:    She has no cervical adenopathy.  Neurological: She is alert and oriented to person, place, and time. She has normal reflexes. No cranial nerve deficit.  Skin: Skin is warm and dry. No rash noted. She is not diaphoretic. No erythema.  Psychiatric: She has a normal mood and affect. Her behavior is normal. Judgment and thought content normal.  Nursing note and vitals reviewed.   BP 104/78 mmHg  Pulse 77  Temp(Src) 98.2 F (36.8 C) (Oral)  Ht 5\' 6"  (1.676 m)  Wt 180 lb 12 oz (81.988 kg)  BMI 29.19 kg/m2  SpO2 95%  LMP 06/19/2014 Wt Readings from Last 3 Encounters:  12/05/14 180 lb 12 oz (81.988 kg)  08/08/14 181 lb (82.101 kg)  01/08/14 186 lb (84.369 kg)     Lab Results  Component Value Date   WBC 6.1 05/26/2012   HGB  14.6 05/26/2012   HCT 42.7 05/26/2012   PLT 218.0 05/26/2012   ALT 12 03/21/2012   AST 18 03/21/2012   NA 138 03/21/2012   K 4.1 03/21/2012   CREATININE 0.7 03/21/2012   BUN 14 03/21/2012   TSH 0.95 03/21/2012    Mr Lumbar Spine Wo Contrast  12/08/2013  CLINICAL DATA:  Lumbosacral radiculopathy EXAM: MRI LUMBAR SPINE WITHOUT CONTRAST TECHNIQUE: Multiplanar, multisequence MR imaging of the lumbar spine was performed. No intravenous contrast was administered. COMPARISON:  None. FINDINGS: Negative for fracture or mass  lesion. Conus medullaris is normal and terminates at L1. L1-2: Small Schmorl's node anteriorly. New no disc protrusion or spinal stenosis. L2-3:  Negative L3-4: Mild disc degeneration with disc space narrowing. 2 mm anterior slip. Advanced facet degeneration with facet and ligamentum flavum hypertrophy contributing to mild spinal stenosis. Neural foramina are patent L4-5: 5 mm anterior slip. Moderately large left paracentral disc protrusion with compression of the thecal sac and left L5 nerve root in the subarticular recess. Severe spinal stenosis is present. There is marked facet hypertrophy bilaterally mild left foraminal encroachment. Right foramen widely patent L5-S1:  Negative IMPRESSION: Grade 1 anterior slip L3-4 with mild spinal stenosis Severe spinal stenosis L4-5. Moderately large left paracentral disc protrusion. Grade 1 anterior slip L4 on L5. Electronically Signed   By: Marlan Palau M.D.   On: 12/08/2013 20:17       Assessment & Plan:   CPX/z00.00 - Patient has been counseled on age-appropriate routine health concerns for screening and prevention. These are reviewed and up-to-date. Immunizations are up-to-date or declined. Labs and ECG reviewed.  Problem List Items Addressed This Visit    Hypertension    BP Readings from Last 3 Encounters:  12/05/14 104/78  08/08/14 130/88  01/08/14 100/78   reviewed home trends with evening blood pressure running higher (140s/90s) In [place], when initiating med treatment for same years ago, pt determined to be intolerant of diuretic and overly sensitive to amlodipine dose > 5 mg Taking maximized ARB dose in morning and amlodipine  at bedtime Patient will keep home log of trends, potential correlation with salt in diet or other stressors reviewed If consistently greater than 140/90, consider increase amlodipine to 7.5 mg versus other therapies. Patient will email [place] with her findings -no specific changes recommended today       Other Visit  Diagnoses    Routine general medical examination at a health care facility    -  Primary        Rene Paci, MD

## 2014-12-11 ENCOUNTER — Encounter: Payer: Self-pay | Admitting: Internal Medicine

## 2014-12-11 DIAGNOSIS — R739 Hyperglycemia, unspecified: Secondary | ICD-10-CM

## 2014-12-11 DIAGNOSIS — Z833 Family history of diabetes mellitus: Secondary | ICD-10-CM

## 2015-02-06 ENCOUNTER — Telehealth: Payer: Self-pay | Admitting: Internal Medicine

## 2015-02-06 MED ORDER — PROMETHAZINE-CODEINE 6.25-10 MG/5ML PO SYRP: 5.0000 mL | ORAL_SOLUTION | ORAL | Status: DC | PRN

## 2015-02-06 MED ORDER — HYDROCOD POLST-CPM POLST ER 10-8 MG/5ML PO SUER: 5.0000 mL | Freq: Two times a day (BID) | ORAL | Status: DC | PRN

## 2015-02-06 NOTE — Telephone Encounter (Signed)
[facility]ed the [place] [facility] - they will not accept a fill from another state.   Called and left pt a message to call back.   RE: PCP recommended that we [facility] the local [facility] and have the [place] [facility] pull the prescription [facility].   I have the signed rx ready to be faxed if pt agrees.

## 2015-02-06 NOTE — Telephone Encounter (Signed)
Called in change per PCP of promethazine and codeine. [place] [facility] accepted [facility]. I will need to send in the rx original via mail.   Pt informed of same and will be back in town on Friday.

## 2015-02-06 NOTE — Telephone Encounter (Signed)
Pt called back and stated [facility] [place] and they said they will except the electronic RX but not the fax. Pleas help, pt provide this # [phone] (she said this is an electronic number we can send in)

## 2015-02-06 NOTE — Telephone Encounter (Signed)
Pt called back. Okay with trying the [facility] transfer.   Not able to per federal law.   Changed to promethazine codeine cough syrup.   [facility] [place] need the hard script within 3 days. Will fill the 2 worth of rx for pt today.

## 2015-02-06 NOTE — Telephone Encounter (Signed)
I have printed and signed same to fax to her pharmacy or call in.  I am uncertain if the prescription will be accepted given that it is out of state, but okay to try

## 2015-02-06 NOTE — Telephone Encounter (Signed)
Pt is in [place] and has a pretty bad sinus infection. [facility] her [facility] antibiotic but she is also needing chlorpheniramine-HYDROcodone cough syrup and they can't prescribe that. She is wondering if you would be able to [facility] [place] [facility] [facility] [place]. Their phone # is [phone]

## 2015-02-18 ENCOUNTER — Ambulatory Visit: Payer: 59 | Admitting: Psychology

## 2015-03-08 ENCOUNTER — Ambulatory Visit (INDEPENDENT_AMBULATORY_CARE_PROVIDER_SITE_OTHER): Payer: 59 | Admitting: Psychology

## 2015-03-08 DIAGNOSIS — F4323 Adjustment disorder with mixed anxiety and depressed mood: Secondary | ICD-10-CM

## 2015-03-14 ENCOUNTER — Ambulatory Visit (INDEPENDENT_AMBULATORY_CARE_PROVIDER_SITE_OTHER): Payer: 59 | Admitting: Psychology

## 2015-03-14 DIAGNOSIS — F4323 Adjustment disorder with mixed anxiety and depressed mood: Secondary | ICD-10-CM | POA: Diagnosis not present

## 2015-03-18 ENCOUNTER — Ambulatory Visit (INDEPENDENT_AMBULATORY_CARE_PROVIDER_SITE_OTHER): Payer: 59 | Admitting: Psychology

## 2015-03-18 DIAGNOSIS — F4323 Adjustment disorder with mixed anxiety and depressed mood: Secondary | ICD-10-CM

## 2015-03-25 ENCOUNTER — Ambulatory Visit (INDEPENDENT_AMBULATORY_CARE_PROVIDER_SITE_OTHER): Payer: 59 | Admitting: Psychology

## 2015-03-25 DIAGNOSIS — F4323 Adjustment disorder with mixed anxiety and depressed mood: Secondary | ICD-10-CM

## 2015-04-01 ENCOUNTER — Ambulatory Visit (INDEPENDENT_AMBULATORY_CARE_PROVIDER_SITE_OTHER): Payer: 59 | Admitting: Psychology

## 2015-04-01 DIAGNOSIS — F4323 Adjustment disorder with mixed anxiety and depressed mood: Secondary | ICD-10-CM

## 2015-04-08 ENCOUNTER — Ambulatory Visit: Payer: 59 | Admitting: Psychology

## 2015-04-25 ENCOUNTER — Ambulatory Visit (INDEPENDENT_AMBULATORY_CARE_PROVIDER_SITE_OTHER): Payer: 59 | Admitting: Psychology

## 2015-04-25 DIAGNOSIS — F4323 Adjustment disorder with mixed anxiety and depressed mood: Secondary | ICD-10-CM | POA: Diagnosis not present

## 2015-05-09 ENCOUNTER — Ambulatory Visit (INDEPENDENT_AMBULATORY_CARE_PROVIDER_SITE_OTHER): Payer: 59 | Admitting: Psychology

## 2015-05-09 DIAGNOSIS — F4323 Adjustment disorder with mixed anxiety and depressed mood: Secondary | ICD-10-CM

## 2015-05-14 ENCOUNTER — Ambulatory Visit: Payer: Self-pay | Admitting: Internal Medicine

## 2015-05-15 ENCOUNTER — Ambulatory Visit (INDEPENDENT_AMBULATORY_CARE_PROVIDER_SITE_OTHER): Payer: 59 | Admitting: Internal Medicine

## 2015-05-15 ENCOUNTER — Encounter: Payer: Self-pay | Admitting: Internal Medicine

## 2015-05-15 VITALS — BP 120/72 | HR 86 | Temp 98.4°F | Resp 20 | Wt 175.0 lb

## 2015-05-15 DIAGNOSIS — I10 Essential (primary) hypertension: Secondary | ICD-10-CM

## 2015-05-15 DIAGNOSIS — K529 Noninfective gastroenteritis and colitis, unspecified: Secondary | ICD-10-CM | POA: Diagnosis not present

## 2015-05-15 DIAGNOSIS — E785 Hyperlipidemia, unspecified: Secondary | ICD-10-CM | POA: Diagnosis not present

## 2015-05-15 DIAGNOSIS — E7800 Pure hypercholesterolemia, unspecified: Secondary | ICD-10-CM | POA: Insufficient documentation

## 2015-05-15 MED ORDER — ONDANSETRON HCL 4 MG PO TABS: 4.0000 mg | ORAL_TABLET | Freq: Three times a day (TID) | ORAL | Status: DC | PRN

## 2015-05-15 NOTE — Patient Instructions (Signed)
Please take all new medication as prescribed - the nausea medication if needed  Please continue all other medications as before, and refills have been done if requested.  Please have the pharmacy call with any other refills you may need.  Please keep your appointments with your specialists as you may have planned  .Please go to the LAB in the Basement (turn left off the elevator) for the tests to be done tomorrow or soon  You will be contacted by phone if any changes need to be made immediately.  Otherwise, you will receive a letter about your results with an explanation, but please check with MyChart first.  Please remember to sign up for MyChart if you have not done so, as this will be important to you in the future with finding out test results, communicating by private email, and scheduling acute appointments online when needed.

## 2015-05-15 NOTE — Progress Notes (Signed)
Pre visit review using our clinic review tool, if applicable. No additional management support is needed unless otherwise documented below in the visit note. 

## 2015-05-15 NOTE — Progress Notes (Signed)
Subjective:    Patient ID: Kathleen Weber, female    DOB: 06/20/1960, 55 y.o.   MRN: [id number]  HPI  Here with 2 wks onset abd pain and nausea and diarrhea started immed after eating out one evening, next day with fatigue, feeling ill/nausea, feverish, diarrhea with large greenish stool several episodes over 24 hrs then this element resolving next day,  was able to be some more active but nausea and some left upper abd sharp pain started and has persisted wax and wane since then, but still feels overall improved but concerend since then but still with low appetite, nausea intermittent, and the recurring LUQ pain, and general weakness, though no fever, vomiting, and now normal BM's in the past week or more. No hx of colitis or IBD .  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 Wt Readings from Last 3 Encounters:  05/15/15 175 lb (79.379 kg)  12/05/14 180 lb 12 oz (81.988 kg)  08/08/14 181 lb (82.101 kg)  Has lost several lbs in past 2 wks, of which she states is welcome Past Medical History  Diagnosis Date  . Hypertension   . Thyroid nodule   . Neuropathy (HCC)   . Arthritis   . Chicken pox   . Gastritis    Past Surgical History  Procedure Laterality Date  . Neg hx      reports that she has never smoked. She has never used smokeless tobacco. She reports that she does not drink alcohol or use illicit drugs. family history includes Coronary artery disease (age of onset: 7372) in her father; Diabetes in her father; Heart disease in her mother; Hypertension in her father and mother; Kidney disease in her mother; Lymphoma in her maternal grandmother; Stomach cancer in her mother; Stroke in her father. Allergies  Allergen Reactions  . Codeine Other (See Comments)    Hot flash, nausea, feels faint   Current Outpatient Prescriptions on File Prior to Visit    Medication Sig Dispense Refill  . amLODipine (NORVASC) 5 MG tablet Take 1 tablet (5 mg total) by mouth at bedtime. 90 tablet 3  . cyclobenzaprine (FLEXERIL) 5 MG tablet Take 1 tablet (5 mg total) by mouth at bedtime as needed for muscle spasms. 20 tablet 0  . Ferrous Sulfate (IRON) 325 (65 FE) MG TABS Take 1 tablet by mouth daily.    Marland Kitchen. gabapentin (NEURONTIN) 100 MG capsule Take 2 capsules (200 mg total) by mouth as needed. 60 capsule 3  . glycopyrrolate (ROBINUL-FORTE) 2 MG tablet Take 1 tablet (2 mg total) by mouth 2 (two) times daily. 60 tablet 10  . Multiple Vitamin (MULTIVITAMIN) tablet Take 1 tablet by mouth daily.    . Probiotic Product (PROBIOTIC DAILY PO) Take by mouth daily.    . valsartan (DIOVAN) 320 MG tablet Take 1 tablet (320 mg total) by mouth daily. 90 tablet 3  . vitamin B-12 (CYANOCOBALAMIN) 1000 MCG tablet Take 1,000 mcg by mouth daily.     No current facility-administered medications on file prior to visit.   Review of Systems  Constitutional: Negative for unusual diaphoresis or night sweats HENT: Negative for ear swelling or discharge Eyes: Negative for worsening visual haziness  Respiratory: Negative for choking and stridor.   Gastrointestinal: Negative for distension or worsening eructation Genitourinary: Negative for retention or change in urine volume.  Musculoskeletal: Negative for other MSK pain or swelling Skin:  Negative for color change and worsening wound Neurological: Negative for tremors and numbness other than noted  Psychiatric/Behavioral: Negative for decreased concentration or agitation other than above       Objective:   Physical Exam BP 120/72 mmHg  Pulse 86  Temp(Src) 98.4 F (36.9 C) (Oral)  Resp 20  Wt 175 lb (79.379 kg)  SpO2 95% VS noted, non toxic, not ill appearing though fatigued it seems Constitutional: Pt appears in no apparent distress HENT: Head: NCAT.  Right Ear: External ear normal.  Left Ear: External ear normal.  Bilat  tm's with mild erythema.  Max sinus areas non tender.  Pharynx with mild erythema, no exudate Eyes: . Pupils are equal, round, and reactive to light. Conjunctivae and EOM are normal Neck: Normal range of motion. Neck supple.  Cardiovascular: Normal rate and regular rhythm.   Pulmonary/Chest: Effort normal and breath sounds without rales or wheezing.  Abd:  Soft, NT, ND, + BS except for vague mild tender to LUQ/left abd and somewhat LLQ Neurological: Pt is alert. Not confused , motor grossly intact Skin: Skin is warm. No rash, no LE edema Psychiatric: Pt behavior is normal. No agitation. mild nervous     Assessment & Plan:

## 2015-05-16 ENCOUNTER — Ambulatory Visit: Payer: 59 | Admitting: Psychology

## 2015-05-16 NOTE — Assessment & Plan Note (Signed)
stable overall by history and exam, recent data reviewed with pt, and pt to continue medical treatment as before,  to f/u any worsening symptoms or concerns BP Readings from Last 3 Encounters:  05/15/15 120/72  12/05/14 104/78  08/08/14 130/88

## 2015-05-16 NOTE — Assessment & Plan Note (Signed)
stable overall by history and exam, recent data reviewed with pt, and pt to continue medical treatment as before,  to f/u any worsening symptoms or concerns Lab Results  Component Value Date   LDLCALC 135* 12/05/2014   Pt requests lipids with next labs, goal ldl < 100

## 2015-05-16 NOTE — Assessment & Plan Note (Signed)
Clinical diagnosis, likely recent viral illness with symtpoms overall improved, no further diarrhea, and related symptoms now gradually improving overall, is currently afeb with vomiting or expected further wt loss, for antiemetic prn, check labs as documented, but would hold on specific other eval such as CT abd or tx such as cipro/flagyl for now,  to f/u any worsening symptoms or concerns

## 2015-05-23 ENCOUNTER — Other Ambulatory Visit (INDEPENDENT_AMBULATORY_CARE_PROVIDER_SITE_OTHER): Payer: 59

## 2015-05-23 DIAGNOSIS — E785 Hyperlipidemia, unspecified: Secondary | ICD-10-CM | POA: Diagnosis not present

## 2015-05-23 DIAGNOSIS — K529 Noninfective gastroenteritis and colitis, unspecified: Secondary | ICD-10-CM

## 2015-05-23 LAB — CBC WITH DIFFERENTIAL/PLATELET
BASOS PCT: 0.8 % (ref 0.0–3.0)
Basophils Absolute: 0 10*3/uL (ref 0.0–0.1)
Eosinophils Absolute: 0.1 10*3/uL (ref 0.0–0.7)
Eosinophils Relative: 1.3 % (ref 0.0–5.0)
HCT: 43.9 % (ref 36.0–46.0)
HEMOGLOBIN: 14.9 g/dL (ref 12.0–15.0)
LYMPHS ABS: 1.9 10*3/uL (ref 0.7–4.0)
Lymphocytes Relative: 42.6 % (ref 12.0–46.0)
MCHC: 33.9 g/dL (ref 30.0–36.0)
MCV: 83.5 fl (ref 78.0–100.0)
MONOS PCT: 8.4 % (ref 3.0–12.0)
Monocytes Absolute: 0.4 10*3/uL (ref 0.1–1.0)
Neutro Abs: 2.1 10*3/uL (ref 1.4–7.7)
Neutrophils Relative %: 46.9 % (ref 43.0–77.0)
Platelets: 172 10*3/uL (ref 150.0–400.0)
RBC: 5.26 Mil/uL — AB (ref 3.87–5.11)
RDW: 12.3 % (ref 11.5–15.5)
WBC: 4.4 10*3/uL (ref 4.0–10.5)

## 2015-05-23 LAB — URINALYSIS, ROUTINE W REFLEX MICROSCOPIC
BILIRUBIN URINE: NEGATIVE
HGB URINE DIPSTICK: NEGATIVE
Ketones, ur: NEGATIVE
Leukocytes, UA: NEGATIVE
NITRITE: NEGATIVE
Specific Gravity, Urine: 1.01 (ref 1.000–1.030)
Total Protein, Urine: NEGATIVE
Urine Glucose: NEGATIVE
Urobilinogen, UA: 0.2 (ref 0.0–1.0)
pH: 6.5 (ref 5.0–8.0)

## 2015-05-23 LAB — LIPID PANEL
CHOL/HDL RATIO: 4
Cholesterol: 184 mg/dL (ref 0–200)
HDL: 46.3 mg/dL (ref >39.00)
LDL Cholesterol: 120 mg/dL — ABNORMAL HIGH (ref 0–99)
NONHDL: 137.82
Triglycerides: 87 mg/dL (ref 0.0–149.0)
VLDL: 17.4 mg/dL (ref 0.0–40.0)

## 2015-05-23 LAB — HEPATIC FUNCTION PANEL
ALK PHOS: 57 U/L (ref 39–117)
ALT: 14 U/L (ref 0–35)
AST: 19 U/L (ref 0–37)
Albumin: 4.1 g/dL (ref 3.5–5.2)
BILIRUBIN TOTAL: 0.6 mg/dL (ref 0.2–1.2)
Bilirubin, Direct: 0.1 mg/dL (ref 0.0–0.3)
Total Protein: 7.5 g/dL (ref 6.0–8.3)

## 2015-05-23 LAB — TSH: TSH: 0.84 u[IU]/mL (ref 0.35–4.50)

## 2015-05-23 LAB — BASIC METABOLIC PANEL
BUN: 10 mg/dL (ref 6–23)
CALCIUM: 9.3 mg/dL (ref 8.4–10.5)
CO2: 30 mEq/L (ref 19–32)
Chloride: 103 mEq/L (ref 96–112)
Creatinine, Ser: 0.71 mg/dL (ref 0.40–1.20)
GFR: 90.71 mL/min (ref >60.00)
Glucose, Bld: 100 mg/dL — ABNORMAL HIGH (ref 70–99)
POTASSIUM: 3.4 meq/L — AB (ref 3.5–5.1)
SODIUM: 141 meq/L (ref 135–145)

## 2015-05-23 LAB — LIPASE: LIPASE: 46 U/L (ref 11.0–59.0)

## 2015-05-30 ENCOUNTER — Ambulatory Visit (INDEPENDENT_AMBULATORY_CARE_PROVIDER_SITE_OTHER): Payer: 59 | Admitting: Psychology

## 2015-05-30 DIAGNOSIS — F4323 Adjustment disorder with mixed anxiety and depressed mood: Secondary | ICD-10-CM | POA: Diagnosis not present

## 2015-06-13 ENCOUNTER — Ambulatory Visit (INDEPENDENT_AMBULATORY_CARE_PROVIDER_SITE_OTHER): Payer: 59 | Admitting: Psychology

## 2015-06-18 ENCOUNTER — Ambulatory Visit (INDEPENDENT_AMBULATORY_CARE_PROVIDER_SITE_OTHER): Payer: 59 | Admitting: Gastroenterology

## 2015-06-18 ENCOUNTER — Encounter: Payer: Self-pay | Admitting: Gastroenterology

## 2015-06-18 VITALS — BP 138/78 | HR 82 | Ht 66.0 in | Wt 175.0 lb

## 2015-06-18 DIAGNOSIS — K591 Functional diarrhea: Secondary | ICD-10-CM | POA: Diagnosis not present

## 2015-06-18 DIAGNOSIS — K589 Irritable bowel syndrome without diarrhea: Secondary | ICD-10-CM

## 2015-06-18 MED ORDER — HYOSCYAMINE SULFATE 0.125 MG SL SUBL: 0.1250 mg | SUBLINGUAL_TABLET | Freq: Four times a day (QID) | SUBLINGUAL | Status: DC | PRN

## 2015-06-18 NOTE — Progress Notes (Signed)
Kathleen Weber  Chief Complaint: Diarrhea.  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 She has been much improved in terms ofnot having fecal urgency and diarrhea. Since the school started she has had several stressful episodes resulting in diarrhea but overall she feels improved. She associates her diarrhea with stress. Colonoscopy in June 2014 was normal. She has declined use of SSRIs or tranquilizers but she has started stress management counseling."  Kathleen HesselbachMaria has been feeling well since her last visit with Dr. Juanda Weber. She manages her IBS with a daily fiber supplement, and she has not needed an antispasmodic. Certain foods and stress are and occasional trigger for symptoms. She was concerned because she has had 2 episodes in the last few weeks of diarrhea that occurred for a day on 1 occasion and 4 days on another occasion. Stool was sometimes green or RR and there was also mucus and some flecks of stool. She had not been traveling or had any new meds or sick contacts, but she was under stress having to travel for conference. She denies abdominal pain or rectal bleeding.  ROS: Cardiovascular:  no chest pain Respiratory: no dyspnea  The patient's Past Medical, Family and Social History were reviewed and are on file in the EMR.  Objective:  Med list reviewed  Vital signs in last 24 hrs: Filed Vitals:   06/18/15 1628  BP: 138/78  Pulse: 82    Physical Exam   HEENT: sclera anicteric, oral mucosa moist without lesions  Neck: supple, no thyromegaly, JVD or lymphadenopathy  Cardiac: RRR without murmurs, S1S2 heard, no peripheral edema  Pulm: clear to auscultation bilaterally, normal RR and effort noted  Abdomen: soft, No tenderness, with  active bowel sounds. No guarding or palpable hepatosplenomegaly.  Skin; warm and dry, no jaundice or rash    @ASSESSMENTPLANBEGIN @ Assessment: Encounter Diagnoses  Name Primary?  . IBS (irritable bowel syndrome) Yes  . Functional diarrhea     Sounds like she had 2 small flares of her IBS lately, possibly triggered by stress but hard to say for sure. She is now back to her baseline of feeling well.  Plan: When necessary Levsin Follow-up as needed   Kathleen PitterHenry L Danis Weber

## 2015-06-18 NOTE — Patient Instructions (Signed)
We have sent the following medications to your pharmacy for you to pick up at your convenience: Levsin   If you are age 56 or older, your body mass index should be between 23-30. Your Body mass index is 28.26 kg/(m^2). If this is out of the aforementioned range listed, please consider follow up with your Primary Care Provider.  If you are age 564 or younger, your body mass index should be between 19-25. Your Body mass index is 28.26 kg/(m^2). If this is out of the aformentioned range listed, please consider follow up with your Primary Care Provider.   Thank you for choosing [facility]  Dr Amada JupiterHenry Danis III

## 2015-07-02 ENCOUNTER — Ambulatory Visit (INDEPENDENT_AMBULATORY_CARE_PROVIDER_SITE_OTHER): Payer: 59 | Admitting: Psychology

## 2015-07-11 ENCOUNTER — Ambulatory Visit (INDEPENDENT_AMBULATORY_CARE_PROVIDER_SITE_OTHER): Payer: 59 | Admitting: Psychology

## 2015-07-11 DIAGNOSIS — F4323 Adjustment disorder with mixed anxiety and depressed mood: Secondary | ICD-10-CM

## 2015-08-05 ENCOUNTER — Ambulatory Visit (INDEPENDENT_AMBULATORY_CARE_PROVIDER_SITE_OTHER): Payer: 59 | Admitting: Internal Medicine

## 2015-08-05 ENCOUNTER — Encounter: Payer: Self-pay | Admitting: Internal Medicine

## 2015-08-05 VITALS — BP 122/86 | HR 76 | Temp 98.6°F | Resp 16 | Ht 66.0 in | Wt 173.0 lb

## 2015-08-05 DIAGNOSIS — I10 Essential (primary) hypertension: Secondary | ICD-10-CM

## 2015-08-05 DIAGNOSIS — K589 Irritable bowel syndrome without diarrhea: Secondary | ICD-10-CM | POA: Insufficient documentation

## 2015-08-05 MED ORDER — AMLODIPINE BESYLATE 5 MG PO TABS: 5.0000 mg | ORAL_TABLET | Freq: Every day | ORAL | Status: DC

## 2015-08-05 MED ORDER — VALSARTAN 320 MG PO TABS: 320.0000 mg | ORAL_TABLET | Freq: Every day | ORAL | Status: DC

## 2015-08-05 NOTE — Progress Notes (Signed)
Subjective:    Patient ID: Kathleen Weber, female    DOB: 03-May-1960, 55 y.o.   MRN: [id number]  HPI She is here to establish with a new pcp.  She is here for follow up.  Hypertension: She is taking her medication daily. She is compliant with a low sodium diet.  She denies chest pain, palpitations, edema, shortness of breath and regular headaches. She is exercising regularly.  She does not monitor her blood pressure at home regularly.    She has a history of bowel urgency.  She was seen recently for possible colitis - it was thought to be related to IBS.  She was prescribed glycopyrrolate and more recently hyoscyamine.  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  She has not had any issues since then and has not needed to try the hyoscyamine.  She does feel it is related to stress.   Medications and allergies reviewed with patient and updated if appropriate.  Patient Active Problem List   Diagnosis Date Noted  . Colitis 05/15/2015  . Hyperlipidemia 05/15/2015  . Hypertension 12/19/2012  . Allergic rhinitis 12/19/2012  . Thyroid nodule   . Bilateral hand numbness   . Overweight     Current Outpatient Prescriptions on File Prior to Visit  Medication Sig Dispense Refill  . amLODipine (NORVASC) 5 MG tablet Take 1 tablet (5 mg total) by mouth at bedtime. 90 tablet 3  . cyclobenzaprine (FLEXERIL) 5 MG tablet Take 1 tablet (5 mg total) by mouth at bedtime as needed for muscle spasms. 20 tablet 0  . Ferrous Sulfate (IRON) 325 (65 FE) MG TABS Take 1 tablet by mouth daily.    Marland Kitchen. gabapentin (NEURONTIN) 100 MG capsule Take 2 capsules (200 mg total) by mouth as needed. 60 capsule 3  . glycopyrrolate (ROBINUL-FORTE) 2 MG tablet Take 1 tablet (2 mg total) by mouth 2 (two) times daily. 60 tablet 10  . hyoscyamine (LEVSIN SL) 0.125 MG SL tablet Place 1 tablet (0.125 mg total) under the tongue every 6 (six) hours as needed. 30 tablet 1  . Multiple  Vitamin (MULTIVITAMIN) tablet Take 1 tablet by mouth daily.    . Probiotic Product (PROBIOTIC DAILY PO) Take by mouth daily.    . valsartan (DIOVAN) 320 MG tablet Take 1 tablet (320 mg total) by mouth daily. 90 tablet 3  . vitamin B-12 (CYANOCOBALAMIN) 1000 MCG tablet Take 1,000 mcg by mouth daily.     No current facility-administered medications on file prior to visit.    Past Medical History  Diagnosis Date  . Hypertension   . Thyroid nodule   . Neuropathy (HCC)   . Arthritis   . Chicken pox   . Gastritis     Past Surgical History  Procedure Laterality Date  . Neg hx      Social History   Social History  . Marital Status: Legally Separated    Spouse Name: N/A  . Number of Children: 2  . Years of Education: N/A   Occupational History  . teacher    Social History Main Topics  . Smoking status: Never Smoker   . Smokeless tobacco: Never Used  . Alcohol Use: No  . Drug Use: No  . Sexual Activity: Not Asked   Other Topics Concern  . None   Social History Narrative    Family History  Problem Relation Age of Onset  . Stomach cancer Mother   .  Diabetes Father   . Heart disease Mother   . Coronary artery disease Father [id number]    CABGx5  . Kidney disease Mother     kidney cancer  . Lymphoma Maternal Grandmother   . Stroke Father     TIAs  . Hypertension Mother   . Hypertension Father   . Colon cancer Neg Hx     Review of Systems  Constitutional: Negative for fever and chills.  Respiratory: Negative for shortness of breath and wheezing.   Cardiovascular: Positive for leg swelling (with increased sodium intake - mild and only on occasion). Negative for chest pain and palpitations.  Gastrointestinal: Negative for nausea and abdominal pain.       No GERD  Neurological: Negative for dizziness, light-headedness and headaches.       Objective:   Filed Vitals:   08/05/15 1041  BP: 122/86  Pulse: 76  Temp: 98.6 F (37 C)  Resp: 16   Filed Weights    08/05/15 1041  Weight: 173 lb (78.472 kg)   Body mass index is 27.94 kg/(m^2).   Physical Exam Constitutional: Appears well-developed and well-nourished. No distress.  Neck: Neck supple. No tracheal deviation present. No thyromegaly present.  No carotid bruit. No cervical adenopathy.   Cardiovascular: Normal rate, regular rhythm and normal heart sounds.   No murmur heard.  No edema Pulmonary/Chest: Effort normal and breath sounds normal. No respiratory distress. No wheezes.       Assessment & Plan:   See Problem List for Assessment and Plan of chronic medical problems.  F/u annually

## 2015-08-05 NOTE — Assessment & Plan Note (Addendum)
Her diastolic tends to be on the higher side - she plans on starting to exercise and wants to lose weight BP well controlled - diastolic is less than 90 so not worth adding in another medication and risk of side effects - work on exercise/weight loss She will monitor at home to make sure BP is controlled Current regimen effective and well tolerated Continue current medications at current doses cmp reviewed from 2 months ago

## 2015-08-05 NOTE — Progress Notes (Signed)
Pre visit review using our clinic review tool, if applicable. No additional management support is needed unless otherwise documented below in the visit note. 

## 2015-08-05 NOTE — Patient Instructions (Addendum)
  Your recent blood work was reviewed.   All other Health Maintenance issues reviewed.   All recommended immunizations and age-appropriate screenings are up-to-date or discussed.  No immunizations administered today.   Medications reviewed and updated.  No changes recommended at this time.    Please followup in one year

## 2015-08-06 ENCOUNTER — Ambulatory Visit (INDEPENDENT_AMBULATORY_CARE_PROVIDER_SITE_OTHER): Payer: 59 | Admitting: Psychology

## 2015-08-26 ENCOUNTER — Ambulatory Visit (INDEPENDENT_AMBULATORY_CARE_PROVIDER_SITE_OTHER): Payer: 59 | Admitting: Psychology

## 2015-08-26 DIAGNOSIS — F4323 Adjustment disorder with mixed anxiety and depressed mood: Secondary | ICD-10-CM | POA: Diagnosis not present

## 2015-09-12 ENCOUNTER — Ambulatory Visit: Payer: 59 | Admitting: Psychology

## 2015-09-24 ENCOUNTER — Ambulatory Visit (INDEPENDENT_AMBULATORY_CARE_PROVIDER_SITE_OTHER): Payer: 59 | Admitting: Psychology

## 2015-09-24 DIAGNOSIS — F4323 Adjustment disorder with mixed anxiety and depressed mood: Secondary | ICD-10-CM | POA: Diagnosis not present

## 2015-11-04 ENCOUNTER — Ambulatory Visit (INDEPENDENT_AMBULATORY_CARE_PROVIDER_SITE_OTHER): Payer: 59 | Admitting: Psychology

## 2015-11-04 DIAGNOSIS — F4323 Adjustment disorder with mixed anxiety and depressed mood: Secondary | ICD-10-CM | POA: Diagnosis not present

## 2015-11-18 ENCOUNTER — Ambulatory Visit (INDEPENDENT_AMBULATORY_CARE_PROVIDER_SITE_OTHER): Payer: 59 | Admitting: Psychology

## 2015-11-18 DIAGNOSIS — F4323 Adjustment disorder with mixed anxiety and depressed mood: Secondary | ICD-10-CM

## 2015-12-02 ENCOUNTER — Ambulatory Visit (INDEPENDENT_AMBULATORY_CARE_PROVIDER_SITE_OTHER): Payer: 59 | Admitting: Psychology

## 2015-12-02 DIAGNOSIS — F4323 Adjustment disorder with mixed anxiety and depressed mood: Secondary | ICD-10-CM | POA: Diagnosis not present

## 2015-12-16 ENCOUNTER — Ambulatory Visit (INDEPENDENT_AMBULATORY_CARE_PROVIDER_SITE_OTHER): Payer: 59 | Admitting: Psychology

## 2015-12-16 DIAGNOSIS — F4323 Adjustment disorder with mixed anxiety and depressed mood: Secondary | ICD-10-CM | POA: Diagnosis not present

## 2015-12-23 ENCOUNTER — Ambulatory Visit (INDEPENDENT_AMBULATORY_CARE_PROVIDER_SITE_OTHER): Payer: 59 | Admitting: Psychology

## 2015-12-23 DIAGNOSIS — F4323 Adjustment disorder with mixed anxiety and depressed mood: Secondary | ICD-10-CM

## 2016-01-13 ENCOUNTER — Ambulatory Visit (INDEPENDENT_AMBULATORY_CARE_PROVIDER_SITE_OTHER): Payer: 59 | Admitting: Psychology

## 2016-01-13 DIAGNOSIS — F4323 Adjustment disorder with mixed anxiety and depressed mood: Secondary | ICD-10-CM | POA: Diagnosis not present

## 2016-01-27 ENCOUNTER — Ambulatory Visit (INDEPENDENT_AMBULATORY_CARE_PROVIDER_SITE_OTHER): Payer: 59 | Admitting: Psychology

## 2016-01-27 DIAGNOSIS — F4323 Adjustment disorder with mixed anxiety and depressed mood: Secondary | ICD-10-CM | POA: Diagnosis not present

## 2016-02-09 ENCOUNTER — Other Ambulatory Visit: Payer: Self-pay | Admitting: Neurology

## 2016-02-11 ENCOUNTER — Ambulatory Visit (INDEPENDENT_AMBULATORY_CARE_PROVIDER_SITE_OTHER): Payer: 59 | Admitting: Psychology

## 2016-02-11 DIAGNOSIS — F4323 Adjustment disorder with mixed anxiety and depressed mood: Secondary | ICD-10-CM

## 2016-02-13 ENCOUNTER — Ambulatory Visit (INDEPENDENT_AMBULATORY_CARE_PROVIDER_SITE_OTHER): Payer: 59 | Admitting: Neurology

## 2016-02-13 ENCOUNTER — Encounter: Payer: Self-pay | Admitting: Neurology

## 2016-02-13 VITALS — BP 124/78 | HR 82 | Ht 66.0 in | Wt 181.5 lb

## 2016-02-13 DIAGNOSIS — G5603 Carpal tunnel syndrome, bilateral upper limbs: Secondary | ICD-10-CM

## 2016-02-13 MED ORDER — GABAPENTIN 100 MG PO CAPS: 200.0000 mg | ORAL_CAPSULE | Freq: Every day | ORAL | 3 refills | Status: DC

## 2016-02-13 NOTE — Patient Instructions (Addendum)
1.  Take gabapentin 200mg  at bedtime 2.  Try to wear the wrist splints even during the day if possible 3.  If hand swells, okay to apply heat (heating pad or warm wet washcloth) 4.  We will get nerve studies of both hands and I will contact you regarding results and next step for management.
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
## 2016-02-18 ENCOUNTER — Ambulatory Visit: Payer: Self-pay | Admitting: Neurology

## 2016-02-20 ENCOUNTER — Ambulatory Visit (INDEPENDENT_AMBULATORY_CARE_PROVIDER_SITE_OTHER): Payer: 59 | Admitting: Neurology

## 2016-02-20 DIAGNOSIS — G5603 Carpal tunnel syndrome, bilateral upper limbs: Secondary | ICD-10-CM

## 2016-02-20 NOTE — Procedures (Addendum)
[facility]  [address], [address]  [place], [place] [address] Tel: [phone] Fax:  [phone] Test Date:  02/20/2016  Patient: Kathleen Weber DOB: [birth year] Physician: Nita Sickleonika Patel, DO  Sex: Female Height: 5\' 6"  Ref Phys: Shon MilletAdam Jaffe, D.O.  ID#: [id number] Temp: 34.2C Technician: Judie PetitM. Dean   Patient Complaints: This is a 56 year old female with history of carpal tunnel syndrome referred for evaluation of worsening hand paresthesias.   NCV & EMG Findings: Extensive electrodiagnostic testing of the right upper extremity and additional studies of the left shows:  1. Bilateral median sensory responses show prolonged latency (R4.3, L4.3 ms) and on the right, the motor amplitude is reduced (7.6 V).  Bilateral ulnar sensory responses are within normal limits. 2. Bilateral median motor responses show prolonged latency (R4.4, L4.5 ms) and on the right, the motor amplitude is reduced (5.8 mV).  Bilateral ulnar motor responses also show symmetrically reduced amplitude, without conduction velocity slowing or prolonged latency; with normal needle electrode testing of C8/ulnar muscles, these findings are unclear clinical significance.  3. Needle electrode examination shows chronic motor axon loss changes affecting the bilateral abductor pollicis brevis muscle, worse on the right. There is no evidence of accompanied active denervation.  Impression: 1. Right median neuropathy at or distal to the wrist, consistent with the clinical diagnosis of carpal tunnel syndrome. Overall, these findings are severe in degree electrically and have progressed since her previous electrodiagnostic study dated 01/03/2014. 2. Left median neuropathy at or distal to the wrist, consistent with the clinical diagnosis of carpal tunnel syndrome. Overall, these findings are moderate in degree electrically and stable.   ___________________________ Nita Sickleonika Patel, DO    Nerve Conduction Studies Anti Sensory  Summary Table   Stim Site NR Peak (ms) Norm Peak (ms) P-T Amp (V) Norm P-T Amp  Left Median Anti Sensory (2nd Digit)  34.2C  Wrist    4.3 <3.6 17.2 >15  Right Median Anti Sensory (2nd Digit)  34.2C  Wrist    4.3 <3.6 7.6 >15  Left Ulnar Anti Sensory (5th Digit)  34.2C  Wrist    2.8 <3.1 26.9 >10  Right Ulnar Anti Sensory (5th Digit)  34.2C  Wrist    2.7 <3.1 32.2 >10   Motor Summary Table   Stim Site NR Onset (ms) Norm Onset (ms) O-P Amp (mV) Norm O-P Amp Site1 Site2 Delta-0 (ms) Dist (cm) Vel (m/s) Norm Vel (m/s)  Left Median Motor (Abd Poll Brev)  34.2C  Wrist    4.5 <4.0 8.2 >6 Elbow Wrist 3.9 24.0 62 >50  Elbow    8.4  7.6         Right Median Motor (Abd Poll Brev)  34.2C  Wrist    4.4 <4.0 5.8 >6 Elbow Wrist 4.0 26.0 65 >50  Elbow    8.4  5.8         Left Ulnar Motor (Abd Dig Minimi)  34.2C  Wrist    2.4 <3.1 6.3 >7 B Elbow Wrist 3.3 19.0 58 >50  B Elbow    5.7  5.7  A Elbow B Elbow 1.7 10.0 59 >50  A Elbow    7.4  5.7         Right Ulnar Motor (Abd Dig Minimi)  34.2C  Wrist    2.6 <3.1 6.6 >7 B Elbow Wrist 3.1 18.0 58 >50  B Elbow    5.7  6.3  A Elbow B Elbow 1.6 10.0 63 >50  A Elbow  7.3  6.4          EMG   Side Muscle Ins Act Fibs Psw Fasc Number Recrt Dur Dur. Amp Amp. Poly Poly. Comment  Right 1stDorInt Nml Nml Nml Nml Nml Nml Nml Nml Nml Nml Nml Nml N/A  Right Abd Poll Brev Nml Nml Nml Nml 3- Rapid Many 1+ Many 1+ Nml Nml ATR  Right Abd Dig Minimi Nml Nml Nml Nml Nml Nml Nml Nml Nml Nml Nml Nml N/A  Right Ext Indicis Nml Nml Nml Nml Nml Nml Nml Nml Nml Nml Nml Nml N/A  Right PronatorTeres Nml Nml Nml Nml Nml Nml Nml Nml Nml Nml Nml Nml N/A  Right Biceps Nml Nml Nml Nml Nml Nml Nml Nml Nml Nml Nml Nml N/A  Right Triceps Nml Nml Nml Nml Nml Nml Nml Nml Nml Nml Nml Nml N/A  Right Deltoid Nml Nml Nml Nml Nml Nml Nml Nml Nml Nml Nml Nml N/A  Left Abd Poll Brev Nml Nml Nml Nml 1- Mod-R Few 1+ Few 1+ Nml Nml N/A  Left Abd Dig Minimi Nml Nml Nml Nml Nml Nml Nml  Nml Nml Nml Nml Nml N/A  Left PronatorTeres Nml Nml Nml Nml Nml Nml Nml Nml Nml Nml Nml Nml N/A  Left Ext Indicis Nml Nml Nml Nml Nml Nml Nml Nml Nml Nml Nml Nml N/A      Waveforms:

## 2016-02-22 ENCOUNTER — Encounter: Payer: Self-pay | Admitting: Internal Medicine

## 2016-02-22 DIAGNOSIS — G5603 Carpal tunnel syndrome, bilateral upper limbs: Secondary | ICD-10-CM | POA: Insufficient documentation

## 2016-02-24 ENCOUNTER — Ambulatory Visit (INDEPENDENT_AMBULATORY_CARE_PROVIDER_SITE_OTHER): Payer: 59 | Admitting: Psychology

## 2016-02-24 ENCOUNTER — Telehealth: Payer: Self-pay

## 2016-02-24 DIAGNOSIS — G5603 Carpal tunnel syndrome, bilateral upper limbs: Secondary | ICD-10-CM

## 2016-02-24 DIAGNOSIS — F4323 Adjustment disorder with mixed anxiety and depressed mood: Secondary | ICD-10-CM | POA: Diagnosis not present

## 2016-02-24 NOTE — Telephone Encounter (Signed)
-----   Message from Drema DallasAdam R Jaffe, DO sent at 02/21/2016  7:22 AM EST ----- Nerve study does demonstrate worsening carpal tunnel syndrome (compared to her prior study) in both hands.  Due to severity, I would like to refer her to a surgeon.

## 2016-02-24 NOTE — Telephone Encounter (Signed)
Called patient. Gave nerve study results.  Went over referral process. Patient verbalized understanding.

## 2016-03-02 ENCOUNTER — Ambulatory Visit: Payer: Self-pay | Admitting: Family Medicine

## 2016-03-05 ENCOUNTER — Encounter: Payer: Self-pay | Admitting: Adult Health

## 2016-03-05 ENCOUNTER — Ambulatory Visit (INDEPENDENT_AMBULATORY_CARE_PROVIDER_SITE_OTHER): Payer: 59 | Admitting: Adult Health

## 2016-03-05 VITALS — BP 132/70 | Temp 98.2°F | Ht 66.0 in | Wt 181.2 lb

## 2016-03-05 DIAGNOSIS — J069 Acute upper respiratory infection, unspecified: Secondary | ICD-10-CM

## 2016-03-05 MED ORDER — HYDROCODONE-HOMATROPINE 5-1.5 MG/5ML PO SYRP: 5.0000 mL | ORAL_SOLUTION | Freq: Three times a day (TID) | ORAL | 0 refills | Status: DC | PRN

## 2016-03-05 MED ORDER — DOXYCYCLINE HYCLATE 100 MG PO CAPS: 100.0000 mg | ORAL_CAPSULE | Freq: Two times a day (BID) | ORAL | 0 refills | Status: DC

## 2016-03-05 NOTE — Progress Notes (Signed)
Subjective:    Patient ID: Kathleen Weber, female    DOB: Sep 09, 1960, 56 y.o.   MRN: [id number]  URI   This is a new problem. The current episode started 1 to 4 weeks ago. The problem has been gradually worsening. There has been no fever. Associated symptoms include congestion, coughing (non productive ), ear pain, headaches, nausea, rhinorrhea and sinus pain. Pertinent negatives include no diarrhea, sore throat or vomiting. Treatments tried: hycodan  The treatment provided mild relief.    Review of Systems  Constitutional: Positive for activity change and fatigue. Negative for chills, diaphoresis and fever.  HENT: Positive for congestion, ear pain, postnasal drip, rhinorrhea, sinus pain and sinus pressure. Negative for sore throat.   Eyes: Negative.   Respiratory: Positive for cough (non productive ).   Gastrointestinal: Positive for nausea. Negative for diarrhea and vomiting.  Neurological: Positive for headaches.   Past Medical History:  Diagnosis Date  . Arthritis   . Chicken pox   . Gastritis   . Hypertension   . Neuropathy (HCC)   . Thyroid nodule     Social History   Social History  . Marital status: Legally Separated    Spouse name: N/A  . Number of children: 2  . 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 Smoking status: Never Smoker  . Smokeless tobacco: Never Used  . Alcohol use No  . Drug use: No  . Sexual activity: Not on file   Other Topics Concern  . Not on file   Social History Narrative  . No narrative on file    Past Surgical History:  Procedure Laterality Date  . neg hx      Family History  Problem Relation Age of Onset  . Stomach cancer Mother   . Diabetes Father   . Heart disease Mother   . Coronary artery disease Father [id number]    CABGx5  . Kidney disease Mother     kidney cancer  . Lymphoma Maternal Grandmother   . Stroke Father     TIAs  .  Hypertension Mother   . Hypertension Father   . Colon cancer Neg Hx     Allergies  Allergen Reactions  . Codeine Other (See Comments)    Hot flash, nausea, feels faint    Current Outpatient Prescriptions on File Prior to Visit  Medication Sig Dispense Refill  . amLODipine (NORVASC) 5 MG tablet Take 1 tablet (5 mg total) by mouth at bedtime. 90 tablet 3  . cyclobenzaprine (FLEXERIL) 5 MG tablet Take 1 tablet (5 mg total) by mouth at bedtime as needed for muscle spasms. 20 tablet 0  . Ferrous Sulfate (IRON) 325 (65 FE) MG TABS Take 1 tablet by mouth daily.    Marland Kitchen. gabapentin (NEURONTIN) 100 MG capsule Take 2 capsules (200 mg total) by mouth at bedtime. 60 capsule 3  . glycopyrrolate (ROBINUL-FORTE) 2 MG tablet Take 1 tablet (2 mg total) by mouth 2 (two) times daily. 60 tablet 10  . hyoscyamine (LEVSIN SL) 0.125 MG SL tablet Place 1 tablet (0.125 mg total) under the tongue every 6 (six) hours as needed. 30 tablet 1  . Multiple Vitamin (MULTIVITAMIN) tablet Take 1 tablet by mouth daily.    . Probiotic Product (PROBIOTIC DAILY PO) Take by mouth daily.    . valsartan (DIOVAN) 320 MG tablet Take 1 tablet (320 mg total) by mouth daily. 90 tablet  3  . vitamin B-12 (CYANOCOBALAMIN) 1000 MCG tablet Take 1,000 mcg by mouth daily.     No current facility-administered medications on file prior to visit.     BP 132/70   Temp 98.2 F (36.8 C) (Oral)   Ht 5\' 6"  (1.676 m)   Wt 181 lb 3.2 oz (82.2 kg)   BMI 29.25 kg/m       Objective:   Physical Exam  Constitutional: She is oriented to person, place, and time. She appears well-developed and well-nourished. No distress.  HENT:  Head: Normocephalic and atraumatic.  Right Ear: External ear normal.  Left Ear: External ear normal.  Nose: Mucosal edema and rhinorrhea present. Right sinus exhibits frontal sinus tenderness. Right sinus exhibits no maxillary sinus tenderness. Left sinus exhibits frontal sinus tenderness. Left sinus exhibits no maxillary  sinus tenderness.  Mouth/Throat: Uvula is midline and mucous membranes are normal. Oropharyngeal exudate present.  Eyes: Conjunctivae and EOM are normal. Pupils are equal, round, and reactive to light. Right eye exhibits no discharge. Left eye exhibits no discharge. No scleral icterus.  Neck: Normal range of motion. Neck supple. No thyromegaly present.  Cardiovascular: Normal rate, regular rhythm, normal heart sounds and intact distal pulses.  Exam reveals no friction rub.   No murmur heard. Pulmonary/Chest: Effort normal and breath sounds normal. No respiratory distress. She has no wheezes. She has no rales. She exhibits no tenderness.  Lymphadenopathy:    She has no cervical adenopathy.  Neurological: She is alert and oriented to person, place, and time.  Skin: Skin is warm and dry. No rash noted. She is not diaphoretic. No erythema. No pallor.  Psychiatric: She has a normal mood and affect. Her behavior is normal. Judgment and thought content normal.  Nursing note and vitals reviewed.     Assessment & Plan:  1. Acute upper respiratory infection - HYDROcodone-homatropine (HYCODAN) 5-1.5 MG/5ML syrup; Take 5 mLs by mouth every 8 (eight) hours as needed for cough.  Dispense: 120 mL; Refill: 0 - doxycycline (VIBRAMYCIN) 100 MG capsule; Take 1 capsule (100 mg total) by mouth 2 (two) times daily.  Dispense: 14 capsule; Refill: 0 - Stay hydrated and rest - Tylenol/Motrin for symptom relief - Follow up if no improvement  Shirline Frees, NP

## 2016-03-16 ENCOUNTER — Ambulatory Visit: Payer: Self-pay | Admitting: Psychology

## 2016-04-09 ENCOUNTER — Ambulatory Visit (INDEPENDENT_AMBULATORY_CARE_PROVIDER_SITE_OTHER): Payer: 59 | Admitting: Psychology

## 2016-04-09 DIAGNOSIS — F4323 Adjustment disorder with mixed anxiety and depressed mood: Secondary | ICD-10-CM

## 2016-04-20 ENCOUNTER — Ambulatory Visit: Payer: 59 | Admitting: Psychology

## 2016-04-21 ENCOUNTER — Telehealth: Payer: Self-pay

## 2016-04-21 NOTE — Telephone Encounter (Signed)
-----   Message from Sammuel CooperAmy S Esterwood, PA-C sent at 04/21/2016  9:12 AM EDT ----- Regarding: office appt  Pt called this am- having episodes of severe upper abdomeinal pain after dinner past week with nausea- has seen Danis - hx IBS - not sure this is IBS -- told her  we would  Get her worked in this week - she has levsin and will use that ac meals---please get her an appt ASAP

## 2016-04-21 NOTE — Telephone Encounter (Signed)
Left message for patient I have scheduled her an office visit for 3/14 at 9:00 with APP. Asked her to call if this is not convenient to reschedule.

## 2016-04-22 ENCOUNTER — Encounter: Payer: Self-pay | Admitting: Nurse Practitioner

## 2016-04-22 ENCOUNTER — Ambulatory Visit (INDEPENDENT_AMBULATORY_CARE_PROVIDER_SITE_OTHER): Payer: 59 | Admitting: Nurse Practitioner

## 2016-04-22 VITALS — BP 108/68 | HR 72 | Ht 66.0 in | Wt 178.0 lb

## 2016-04-22 DIAGNOSIS — R1012 Left upper quadrant pain: Secondary | ICD-10-CM | POA: Diagnosis not present

## 2016-04-22 DIAGNOSIS — R110 Nausea: Secondary | ICD-10-CM | POA: Diagnosis not present

## 2016-04-22 MED ORDER — OMEPRAZOLE 40 MG PO CPDR: 40.0000 mg | DELAYED_RELEASE_CAPSULE | Freq: Two times a day (BID) | ORAL | 0 refills | Status: DC

## 2016-04-22 NOTE — Patient Instructions (Addendum)
Your physician has requested that you go to the basement for the following lab work before leaving today: Lipase  We have sent the following medications to your pharmacy for you to pick up at your convenience: Omeprazole   You have been scheduled for an endoscopy. Please follow written instructions given to you at your visit today. If you use inhalers (even only as needed), please bring them with you on the day of your procedure. Your physician has requested that you go to www.startemmi.com and enter the access code given to you at your visit today. This web site gives a general overview about your procedure. However, you should still follow specific instructions given to you by our office regarding your preparation for the procedure.    I appreciate the opportunity to care for you.

## 2016-04-22 NOTE — Progress Notes (Signed)
     HPI: Patient is a 56 year old female known known to Dr. Myrtie Neitheranis.  She has history of diarrhea predominant IBS and has been treated with bowel antispasmodics and probiotics. Symptoms have often been associated with stress . Patient has declined SSRIs for stress management. Last seen May 2017 for mild IBS flare. Patient here today with husband. She has been have postprandial epigastric pain which radiates toward LUQ but not through to the back. Pain has been waking her up at night. Pain is not positional. No significant NSAID use. No history of PUD.  Pain can last for hours and is associated with nausea without vomiting.  Hyoscyamine doesn't help. No fevers. BMs are actually fine   Past Medical History:  Diagnosis Date  . Arthritis   . Chicken pox   . Gastritis   . Hypertension   . Neuropathy (HCC)   . Thyroid nodule     Patient's surgical history, family medical history, social history, medications and allergies were all reviewed in Epic    Physical Exam: BP 108/68   Pulse 72   Ht 5\' 6"  (1.676 m)   Wt 178 lb (80.7 kg)   BMI 28.73 kg/m   GENERAL: well developed white female in NAD PSYCH: :Pleasant, cooperative, normal affect HEENT: pupils equal, conjunctiva pink, mucous membranes moist, neck supple without masses CARDIAC:  RRR, no murmur heard, no peripheral edema PULM: Normal respiratory effort, lungs CTA bilaterally, no wheezing ABDOMEN:  soft, nontender, nondistended, no obvious masses, no hepatomegaly,  normal bowel sounds SKIN:  turgor, no lesions seen Musculoskeletal:  Normal muscle tone, normal strength NEURO: Alert and oriented x 3, no focal neurologic deficits   ASSESSMENT and PLAN:  56 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 No current problems with bowel movements. Here with postprandial nausea and upper abdominal pain radiating toward LUQ .   - Trial of Omeprazole 40 mg BID before meals. Scheduled tentatively for EGD in 3 weeks. If improving she may cancel a week or so  prior to procedure.  -Recent labs at Rheumatology office (seen there for hand pain) were normal per patient. She will get a copy of those labs sent over. Patient has to leave right now to get back to work but she will return for serum lipase level, possibly additional labs if not improving. -If pain doesn't improve on BID PPI and EGD negative then she may need imaging.     Willette ClusterPaula Marytza Grandpre , NP 04/22/2016, 9:15 AM

## 2016-04-27 ENCOUNTER — Ambulatory Visit (INDEPENDENT_AMBULATORY_CARE_PROVIDER_SITE_OTHER): Payer: 59 | Admitting: Psychology

## 2016-04-27 DIAGNOSIS — F4323 Adjustment disorder with mixed anxiety and depressed mood: Secondary | ICD-10-CM | POA: Diagnosis not present

## 2016-04-27 NOTE — Progress Notes (Signed)
Thank you for sending this case to me. I have reviewed the entire note, and the outlined plan seems appropriate.   Elvena Oyer Danis, MD  

## 2016-04-28 NOTE — Progress Notes (Signed)
I called patient on 3/16 for an update. She is feeling much better on PPI. If continues to improve she may cancel EGD

## 2016-05-18 ENCOUNTER — Ambulatory Visit (INDEPENDENT_AMBULATORY_CARE_PROVIDER_SITE_OTHER): Payer: 59 | Admitting: Psychology

## 2016-05-18 ENCOUNTER — Telehealth: Payer: Self-pay | Admitting: Nurse Practitioner

## 2016-05-18 DIAGNOSIS — F4323 Adjustment disorder with mixed anxiety and depressed mood: Secondary | ICD-10-CM

## 2016-05-18 NOTE — Telephone Encounter (Signed)
Spoke with the patient. She is feeling "90%" better. Evening is still when she has her symptoms. Occurs after eating in the evening but not every time. Should she stay on Prilosec 40 mg daily? She is due a refill soon. Thanks.

## 2016-05-18 NOTE — Telephone Encounter (Signed)
Left message to call back. Is she completely symptoms free?

## 2016-05-20 ENCOUNTER — Other Ambulatory Visit: Payer: Self-pay

## 2016-05-20 MED ORDER — OMEPRAZOLE 40 MG PO CPDR: 40.0000 mg | DELAYED_RELEASE_CAPSULE | Freq: Two times a day (BID) | ORAL | 0 refills | Status: DC

## 2016-05-20 NOTE — Telephone Encounter (Signed)
Patient is requesting callback today. She is hoping for a prescription today because she will be leaving to go out of town tomorrow. [facility] [address]. [facility] # [phone]. Ok to leave detailed message.

## 2016-05-20 NOTE — Telephone Encounter (Signed)
Patient is advised. She decided she will take it once daily and adjust as needed. She will call me if she has any problems.

## 2016-05-20 NOTE — Telephone Encounter (Signed)
She can take Prilosec as needed if her symptoms have improved. Thanks

## 2016-05-27 ENCOUNTER — Ambulatory Visit (INDEPENDENT_AMBULATORY_CARE_PROVIDER_SITE_OTHER): Payer: 59 | Admitting: Psychology

## 2016-05-27 ENCOUNTER — Ambulatory Visit: Payer: 59 | Admitting: Psychology

## 2016-05-27 DIAGNOSIS — F4323 Adjustment disorder with mixed anxiety and depressed mood: Secondary | ICD-10-CM

## 2016-06-03 ENCOUNTER — Encounter: Payer: Self-pay | Admitting: Gastroenterology

## 2016-06-09 ENCOUNTER — Ambulatory Visit (INDEPENDENT_AMBULATORY_CARE_PROVIDER_SITE_OTHER): Payer: 59 | Admitting: Psychology

## 2016-06-09 DIAGNOSIS — F4323 Adjustment disorder with mixed anxiety and depressed mood: Secondary | ICD-10-CM | POA: Diagnosis not present

## 2016-06-15 ENCOUNTER — Ambulatory Visit (INDEPENDENT_AMBULATORY_CARE_PROVIDER_SITE_OTHER): Payer: 59 | Admitting: Psychology

## 2016-06-15 DIAGNOSIS — F4323 Adjustment disorder with mixed anxiety and depressed mood: Secondary | ICD-10-CM

## 2016-06-19 ENCOUNTER — Other Ambulatory Visit: Payer: Self-pay

## 2016-06-19 MED ORDER — OMEPRAZOLE 40 MG PO CPDR: 40.0000 mg | DELAYED_RELEASE_CAPSULE | Freq: Every day | ORAL | 3 refills | Status: DC

## 2016-06-19 MED ORDER — OMEPRAZOLE 40 MG PO CPDR: 40.0000 mg | DELAYED_RELEASE_CAPSULE | Freq: Two times a day (BID) | ORAL | 0 refills | Status: DC

## 2016-06-22 ENCOUNTER — Other Ambulatory Visit: Payer: Self-pay | Admitting: Emergency Medicine

## 2016-06-22 MED ORDER — OMEPRAZOLE 40 MG PO CPDR: 40.0000 mg | DELAYED_RELEASE_CAPSULE | Freq: Two times a day (BID) | ORAL | 2 refills | Status: DC

## 2016-06-29 ENCOUNTER — Ambulatory Visit: Payer: 59 | Admitting: Psychology

## 2016-07-07 ENCOUNTER — Ambulatory Visit: Payer: 59 | Admitting: Psychology

## 2016-07-13 ENCOUNTER — Ambulatory Visit: Payer: 59 | Admitting: Psychology

## 2016-07-23 ENCOUNTER — Ambulatory Visit: Payer: 59 | Admitting: Psychology

## 2016-07-23 ENCOUNTER — Ambulatory Visit (INDEPENDENT_AMBULATORY_CARE_PROVIDER_SITE_OTHER): Payer: 59 | Admitting: Psychology

## 2016-07-23 DIAGNOSIS — F4323 Adjustment disorder with mixed anxiety and depressed mood: Secondary | ICD-10-CM | POA: Diagnosis not present

## 2016-08-04 ENCOUNTER — Telehealth: Payer: Self-pay | Admitting: Internal Medicine

## 2016-08-04 DIAGNOSIS — I10 Essential (primary) hypertension: Secondary | ICD-10-CM

## 2016-08-04 DIAGNOSIS — E7849 Other hyperlipidemia: Secondary | ICD-10-CM

## 2016-08-04 NOTE — Telephone Encounter (Signed)
Pt has a physical scheduled on Thursday, 08/13/16 at 8:30am. She is wanting to have her blood work done the morning of her appointment before she comes up to check in. Can these orders be put in for her?

## 2016-08-05 NOTE — Telephone Encounter (Signed)
Spoke with pt to inform labs have been entered.

## 2016-08-06 ENCOUNTER — Encounter: Payer: 59 | Admitting: Internal Medicine

## 2016-08-10 ENCOUNTER — Ambulatory Visit (INDEPENDENT_AMBULATORY_CARE_PROVIDER_SITE_OTHER): Payer: 59 | Admitting: Psychology

## 2016-08-10 ENCOUNTER — Other Ambulatory Visit: Payer: Self-pay | Admitting: Internal Medicine

## 2016-08-10 DIAGNOSIS — F4323 Adjustment disorder with mixed anxiety and depressed mood: Secondary | ICD-10-CM | POA: Diagnosis not present

## 2016-08-12 NOTE — Progress Notes (Signed)
Subjective:    Patient ID: Kathleen Weber, female    DOB: May 03, 1960, 56 y.o.   MRN: [id number]  HPI   Medications and allergies reviewed with patient and updated if appropriate.  Patient Active Problem List   Diagnosis Date Noted  . Carpal tunnel syndrome, bilateral 02/22/2016  . IBS (irritable bowel syndrome) 08/05/2015  . Colitis 05/15/2015  . Hyperlipidemia 05/15/2015  . Hypertension 12/19/2012  . Allergic rhinitis 12/19/2012  . Thyroid nodule   . Bilateral hand numbness   . Overweight     Current Outpatient Prescriptions on File Prior to Visit  Medication Sig Dispense Refill  . amLODipine (NORVASC) 5 MG tablet TAKE 1 TABLET (5 MG TOTAL) BY MOUTH AT BEDTIME. 90 tablet 0  . cyclobenzaprine (FLEXERIL) 5 MG tablet Take 1 tablet (5 mg total) by mouth at bedtime as needed for muscle spasms. 20 tablet 0  . Ferrous Sulfate (IRON) 325 (65 FE) MG TABS Take 1 tablet by mouth daily.    Marland Kitchen. HYDROcodone-homatropine (HYCODAN) 5-1.5 MG/5ML syrup Take 5 mLs by mouth every 8 (eight) hours as needed for cough. 120 mL 0  . hyoscyamine (LEVSIN SL) 0.125 MG SL tablet Place 1 tablet (0.125 mg total) under the tongue every 6 (six) hours as needed. 30 tablet 1  . Multiple Vitamin (MULTIVITAMIN) tablet Take 1 tablet by mouth daily.    Marland Kitchen. omeprazole (PRILOSEC) 40 MG capsule Take 1 capsule (40 mg total) by mouth 2 (two) times daily. 180 capsule 2  . Probiotic Product (PROBIOTIC DAILY PO) Take by mouth daily.    . valsartan (DIOVAN) 320 MG tablet TAKE 1 TABLET (320 MG TOTAL) BY MOUTH DAILY. 90 tablet 0  . vitamin B-12 (CYANOCOBALAMIN) 1000 MCG tablet Take 1,000 mcg by mouth daily.     No current facility-administered medications on file prior to visit.     Past Medical History:  Diagnosis Date  . Arthritis   . Chicken pox   . Gastritis   . Hypertension   . Neuropathy (HCC)   . Thyroid nodule     Past Surgical History:  Procedure Laterality Date  . neg hx      Social History    Social History  . Marital status: Legally Separated    Spouse name: N/A  . Number of children: 2  . 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 Smoking status: Never Smoker  . Smokeless tobacco: Never Used  . Alcohol use No  . Drug use: No  . Sexual activity: Not on file   Other Topics Concern  . Not on file   Social History Narrative  . No narrative on file    Family History  Problem Relation Age of Onset  . Stomach cancer Mother   . Heart disease Mother   . Kidney disease Mother        kidney cancer  . Hypertension Mother   . Diabetes Father   . Coronary artery disease Father [id number]       CABGx5  . Stroke Father        TIAs  . Hypertension Father   . Lymphoma Maternal Grandmother   . Colon cancer Neg Hx     Review of Systems     Objective:  There were no vitals filed for this visit. There were no vitals filed for this visit. There is no height or weight on file to calculate BMI.  Wt Readings from Last 3 Encounters:  04/22/16 178 lb (80.7 kg)  03/05/16 181 lb 3.2 oz (82.2 kg)  02/13/16 181 lb 8 oz (82.3 kg)     Physical Exam             This encounter was created in error - please disregard.

## 2016-08-13 ENCOUNTER — Encounter: Payer: 59 | Admitting: Internal Medicine

## 2016-09-16 ENCOUNTER — Telehealth: Payer: Self-pay | Admitting: Internal Medicine

## 2016-09-16 MED ORDER — LOSARTAN POTASSIUM 100 MG PO TABS: 100.0000 mg | ORAL_TABLET | Freq: Every day | ORAL | 3 refills | Status: DC

## 2016-09-16 NOTE — Telephone Encounter (Signed)
Patient calling to request medication in place of valsartan.  Patient uses [facility] on [address].  Patient requesting call back in regard.

## 2016-09-16 NOTE — Telephone Encounter (Signed)
A different medications was sen.  She should monitor her blood pressure because the dosing may need to be adjusted.

## 2016-09-17 NOTE — Telephone Encounter (Signed)
Spoke with pt to inform.  

## 2016-09-23 ENCOUNTER — Ambulatory Visit (INDEPENDENT_AMBULATORY_CARE_PROVIDER_SITE_OTHER): Payer: 59 | Admitting: Psychology

## 2016-09-23 DIAGNOSIS — F4323 Adjustment disorder with mixed anxiety and depressed mood: Secondary | ICD-10-CM

## 2016-10-01 ENCOUNTER — Encounter: Payer: Self-pay | Admitting: Internal Medicine

## 2016-10-01 ENCOUNTER — Ambulatory Visit (INDEPENDENT_AMBULATORY_CARE_PROVIDER_SITE_OTHER): Payer: 59 | Admitting: Internal Medicine

## 2016-10-01 ENCOUNTER — Other Ambulatory Visit (INDEPENDENT_AMBULATORY_CARE_PROVIDER_SITE_OTHER): Payer: 59

## 2016-10-01 VITALS — BP 152/86 | HR 74 | Temp 98.8°F | Resp 16 | Ht 66.0 in | Wt 179.0 lb

## 2016-10-01 DIAGNOSIS — R739 Hyperglycemia, unspecified: Secondary | ICD-10-CM

## 2016-10-01 DIAGNOSIS — Z0000 Encounter for general adult medical examination without abnormal findings: Secondary | ICD-10-CM | POA: Diagnosis not present

## 2016-10-01 DIAGNOSIS — I10 Essential (primary) hypertension: Secondary | ICD-10-CM

## 2016-10-01 DIAGNOSIS — E784 Other hyperlipidemia: Secondary | ICD-10-CM | POA: Diagnosis not present

## 2016-10-01 DIAGNOSIS — E611 Iron deficiency: Secondary | ICD-10-CM | POA: Diagnosis not present

## 2016-10-01 DIAGNOSIS — R7303 Prediabetes: Secondary | ICD-10-CM | POA: Insufficient documentation

## 2016-10-01 DIAGNOSIS — E7800 Pure hypercholesterolemia, unspecified: Secondary | ICD-10-CM | POA: Diagnosis not present

## 2016-10-01 DIAGNOSIS — E7849 Other hyperlipidemia: Secondary | ICD-10-CM

## 2016-10-01 LAB — TSH: TSH: 1.34 u[IU]/mL (ref 0.35–4.50)

## 2016-10-01 LAB — CBC WITH DIFFERENTIAL/PLATELET
BASOS ABS: 0 10*3/uL (ref 0.0–0.1)
Basophils Relative: 0.7 % (ref 0.0–3.0)
EOS PCT: 1.8 % (ref 0.0–5.0)
Eosinophils Absolute: 0.1 10*3/uL (ref 0.0–0.7)
HCT: 43.8 % (ref 36.0–46.0)
Hemoglobin: 15.1 g/dL — ABNORMAL HIGH (ref 12.0–15.0)
LYMPHS ABS: 2.4 10*3/uL (ref 0.7–4.0)
Lymphocytes Relative: 47 % — ABNORMAL HIGH (ref 12.0–46.0)
MCHC: 34.5 g/dL (ref 30.0–36.0)
MCV: 85 fl (ref 78.0–100.0)
MONO ABS: 0.4 10*3/uL (ref 0.1–1.0)
Monocytes Relative: 8.1 % (ref 3.0–12.0)
NEUTROS PCT: 42.4 % — AB (ref 43.0–77.0)
Neutro Abs: 2.1 10*3/uL (ref 1.4–7.7)
PLATELETS: 215 10*3/uL (ref 150.0–400.0)
RBC: 5.16 Mil/uL — AB (ref 3.87–5.11)
RDW: 13 % (ref 11.5–15.5)
WBC: 5 10*3/uL (ref 4.0–10.5)

## 2016-10-01 LAB — COMPREHENSIVE METABOLIC PANEL
ALT: 13 U/L (ref 0–35)
AST: 20 U/L (ref 0–37)
Albumin: 4.1 g/dL (ref 3.5–5.2)
Alkaline Phosphatase: 74 U/L (ref 39–117)
BILIRUBIN TOTAL: 0.6 mg/dL (ref 0.2–1.2)
BUN: 12 mg/dL (ref 6–23)
CALCIUM: 9.2 mg/dL (ref 8.4–10.5)
CHLORIDE: 102 meq/L (ref 96–112)
CO2: 32 meq/L (ref 19–32)
CREATININE: 0.68 mg/dL (ref 0.40–1.20)
GFR: 94.87 mL/min (ref >60.00)
GLUCOSE: 100 mg/dL — AB (ref 70–99)
Potassium: 3.7 mEq/L (ref 3.5–5.1)
SODIUM: 141 meq/L (ref 135–145)
Total Protein: 7.4 g/dL (ref 6.0–8.3)

## 2016-10-01 LAB — IBC PANEL
Iron: 79 ug/dL (ref 42–145)
Saturation Ratios: 21.5 % (ref 20.0–50.0)
Transferrin: 262 mg/dL (ref 212.0–360.0)

## 2016-10-01 LAB — LIPID PANEL
CHOLESTEROL: 184 mg/dL (ref 0–200)
HDL: 47.5 mg/dL (ref >39.00)
LDL Cholesterol: 124 mg/dL — ABNORMAL HIGH (ref 0–99)
NonHDL: 136.11
TRIGLYCERIDES: 61 mg/dL (ref 0.0–149.0)
Total CHOL/HDL Ratio: 4
VLDL: 12.2 mg/dL (ref 0.0–40.0)

## 2016-10-01 LAB — HEMOGLOBIN A1C: Hgb A1c MFr Bld: 5.5 % (ref 4.6–6.5)

## 2016-10-01 LAB — FERRITIN: FERRITIN: 224.9 ng/mL (ref 10.0–291.0)

## 2016-10-01 MED ORDER — ZOSTER VAC RECOMB ADJUVANTED 50 MCG/0.5ML IM SUSR: 0.5000 mL | Freq: Once | INTRAMUSCULAR | 0 refills | Status: AC

## 2016-10-01 MED ORDER — HYDROCHLOROTHIAZIDE 12.5 MG PO TABS: 12.5000 mg | ORAL_TABLET | Freq: Every day | ORAL | 5 refills | Status: DC

## 2016-10-01 NOTE — Assessment & Plan Note (Signed)
Check a1c Low sugar / carb diet Stressed regular exercise, weight loss  

## 2016-10-01 NOTE — Assessment & Plan Note (Signed)
Not ideally controlled Will add hctz 12.5 mg daily Continue amlodipine and losartan Monitor at home - goal discussed cmp

## 2016-10-01 NOTE — Progress Notes (Signed)
Subjective:    Patient ID: Kathleen Weber, female    DOB: 1960/03/08, 56 y.o.   MRN: [id number]  HPI She is here for a physical exam.   She is now taking losartan not the valsartan.  BP at home 120-140's/80-90's.  She is exercising 2 times a week.  She wonders about weight loss if that would help.    Medications and allergies reviewed with patient and updated if appropriate.  Patient Active Problem List   Diagnosis Date Noted  . Hyperglycemia 10/01/2016  . Carpal tunnel syndrome, bilateral 02/22/2016  . IBS (irritable bowel syndrome) 08/05/2015  . Colitis 05/15/2015  . Hyperlipidemia 05/15/2015  . Hypertension 12/19/2012  . Allergic rhinitis 12/19/2012  . Thyroid nodule   . Bilateral hand numbness   . Overweight     Current Outpatient Prescriptions on File Prior to Visit  Medication Sig Dispense Refill  . amLODipine (NORVASC) 5 MG tablet TAKE 1 TABLET (5 MG TOTAL) BY MOUTH AT BEDTIME. 90 tablet 0  . cyclobenzaprine (FLEXERIL) 5 MG tablet Take 1 tablet (5 mg total) by mouth at bedtime as needed for muscle spasms. 20 tablet 0  . Ferrous Sulfate (IRON) 325 (65 FE) MG TABS Take 1 tablet by mouth daily.    . hyoscyamine (LEVSIN SL) 0.125 MG SL tablet Place 1 tablet (0.125 mg total) under the tongue every 6 (six) hours as needed. 30 tablet 1  . losartan (COZAAR) 100 MG tablet Take 1 tablet (100 mg total) by mouth daily. 90 tablet 3  . Multiple Vitamin (MULTIVITAMIN) tablet Take 1 tablet by mouth daily.    Marland Kitchen omeprazole (PRILOSEC) 40 MG capsule Take 1 capsule (40 mg total) by mouth 2 (two) times daily. 180 capsule 2  . Probiotic Product (PROBIOTIC DAILY PO) Take by mouth daily.    . vitamin B-12 (CYANOCOBALAMIN) 1000 MCG tablet Take 1,000 mcg by mouth daily.     No current facility-administered medications on file prior to visit.     Past Medical History:  Diagnosis Date  . Arthritis   . Chicken pox   . Gastritis   . Hypertension   . Neuropathy   . Thyroid nodule      Past Surgical History:  Procedure Laterality Date  . neg hx      Social History   Social History  . Marital status: Legally Separated    Spouse name: N/A  . Number of children: 2  . Years of education: N/A   Occupational History  . teacher [employer]   Social History Main Topics  . Smoking status: Never Smoker  . Smokeless tobacco: Never Used  . Alcohol use No  . Drug use: No  . Sexual activity: Not Asked   Other Topics Concern  . None   Social History Narrative  . None    Family History  Problem Relation Age of Onset  . Stomach cancer Mother   . Heart disease Mother   . Kidney disease Mother        kidney cancer  . Hypertension Mother   . Diabetes Father   . Coronary artery disease Father 29       CABGx5  . Stroke Father        TIAs  . Hypertension Father   . Lymphoma Maternal Grandmother   . Colon cancer Neg Hx     Review of Systems  Constitutional: Negative for appetite change, chills, fatigue and fever.  Eyes: Negative for visual disturbance.  Respiratory:  Negative for cough, shortness of breath and wheezing.   Cardiovascular: Positive for leg swelling (occ, mild). Negative for chest pain and palpitations.  Gastrointestinal: Negative for abdominal pain, blood in stool, constipation, diarrhea and nausea.       No gerd  Genitourinary: Negative for dysuria and hematuria.  Musculoskeletal: Positive for arthralgias (mild, OA, stiffness). Negative for back pain.  Skin: Negative for color change and rash.  Neurological: Positive for light-headedness (rare). Negative for headaches.  Psychiatric/Behavioral: Negative for dysphoric mood. The patient is not nervous/anxious.        Objective:   Vitals:   10/01/16 0805  BP: (!) 152/86  Pulse: 74  Resp: 16  Temp: 98.8 F (37.1 C)  SpO2: 98%   Filed Weights   10/01/16 0805  Weight: 179 lb (81.2 kg)   Body mass index is 28.89 kg/m.  Wt Readings from Last 3 Encounters:   10/01/16 179 lb (81.2 kg)  04/22/16 178 lb (80.7 kg)  03/05/16 181 lb 3.2 oz (82.2 kg)     Physical Exam Constitutional: She appears well-developed and well-nourished. No distress.  HENT:  Head: Normocephalic and atraumatic.  Right Ear: External ear normal. Normal ear canal and TM Left Ear: External ear normal.  Normal ear canal and TM Mouth/Throat: Oropharynx is clear and moist.  Eyes: Conjunctivae and EOM are normal.  Neck: Neck supple. No tracheal deviation present. No thyromegaly present.  No carotid bruit  Cardiovascular: Normal rate, regular rhythm and normal heart sounds.   No murmur heard.  No edema. Pulmonary/Chest: Effort normal and breath sounds normal. No respiratory distress. She has no wheezes. She has no rales.  Breast: deferred to Gyn Abdominal: Soft. She exhibits no distension. There is no tenderness.  Lymphadenopathy: She has no cervical adenopathy.  Skin: Skin is warm and dry. She is not diaphoretic.  Psychiatric: She has a normal mood and affect. Her behavior is normal.         Assessment & Plan:   Physical exam: Screening blood work  ordered Immunizations  Up to date  Colonoscopy   Up to date  Mammogram   Up to date  Gyn    Up to date  Eye exams  Up to date EKG none in chart - deferred today - will get one at next visit Exercise 2/week - advised increasing exercise Weight  Will work on weight loss Skin   No concerns Substance abuse   none  See Problem List for Assessment and Plan of chronic medical problems.   FU annually

## 2016-10-01 NOTE — Assessment & Plan Note (Signed)
Check lipid panel  Regular exercise and healthy diet encouraged  

## 2016-10-01 NOTE — Patient Instructions (Addendum)
All other Health Maintenance issues reviewed.   All recommended immunizations and age-appropriate screenings are up-to-date or discussed.  No immunizations administered today.  A shingles vaccine was sent to your pharmacy.   Medications reviewed and updated.  Changes include starting hydrochlorothiazide 12.5 mg daily for your blood pressure.   Your prescription(s) have been submitted to your pharmacy. Please take as directed and contact our office if you believe you are having problem(s) with the medication(s).   Please followup in one year, sooner if BP is not controlled   Health Maintenance, Female Adopting a healthy lifestyle and getting preventive care can go a long way to promote health and wellness. Talk with your health care provider about what schedule of regular examinations is right for you. This is a good chance for you to check in with your provider about disease prevention and staying healthy. In between checkups, there are plenty of things you can do on your own. Experts have done a lot of research about which lifestyle changes and preventive measures are most likely to keep you healthy. Ask your health care provider for more information. Weight and diet Eat a healthy diet  Be sure to include plenty of vegetables, fruits, low-fat dairy products, and lean protein.  Do not eat a lot of foods high in solid fats, added sugars, or salt.  Get regular exercise. This is one of the most important things you can do for your health. ? Most adults should exercise for at least 150 minutes each week. The exercise should increase your heart rate and make you sweat (moderate-intensity exercise). ? Most adults should also do strengthening exercises at least twice a week. This is in addition to the moderate-intensity exercise.  Maintain a healthy weight  Body mass index (BMI) is a measurement that can be used to identify possible weight problems. It estimates body fat based on height and  weight. Your health care provider can help determine your BMI and help you achieve or maintain a healthy weight.  For females 41 years of age and older: ? A BMI below 18.5 is considered underweight. ? A BMI of 18.5 to 24.9 is normal. ? A BMI of 25 to 29.9 is considered overweight. ? A BMI of 30 and above is considered obese.  Watch levels of cholesterol and blood lipids  You should start having your blood tested for lipids and cholesterol at 57 years of age, then have this test every 5 years.  You may need to have your cholesterol levels checked more often if: ? Your lipid or cholesterol levels are high. ? You are older than 57 years of age. ? You are at high risk for heart disease.  Cancer screening Lung Cancer  Lung cancer screening is recommended for adults 60-54 years old who are at high risk for lung cancer because of a history of smoking.  A yearly low-dose CT scan of the lungs is recommended for people who: ? Currently smoke. ? Have quit within the past 15 years. ? Have at least a 30-pack-year history of smoking. A pack year is smoking an average of one pack of cigarettes a day for 1 year.  Yearly screening should continue until it has been 15 years since you quit.  Yearly screening should stop if you develop a health problem that would prevent you from having lung cancer treatment.  Breast Cancer  Practice breast self-awareness. This means understanding how your breasts normally appear and feel.  It also means doing regular breast  self-exams. Let your health care provider know about any changes, no matter how small.  If you are in your 20s or 30s, you should have a clinical breast exam (CBE) by a health care provider every 1-3 years as part of a regular health exam.  If you are 27 or older, have a CBE every year. Also consider having a breast X-ray (mammogram) every year.  If you have a family history of breast cancer, talk to your health care provider about genetic  screening.  If you are at high risk for breast cancer, talk to your health care provider about having an MRI and a mammogram every year.  Breast cancer gene (BRCA) assessment is recommended for women who have family members with BRCA-related cancers. BRCA-related cancers include: ? Breast. ? Ovarian. ? Tubal. ? Peritoneal cancers.  Results of the assessment will determine the need for genetic counseling and BRCA1 and BRCA2 testing.  Cervical Cancer Your health care provider may recommend that you be screened regularly for cancer of the pelvic organs (ovaries, uterus, and vagina). This screening involves a pelvic examination, including checking for microscopic changes to the surface of your cervix (Pap test). You may be encouraged to have this screening done every 3 years, beginning at age 38.  For women ages 44-65, health care providers may recommend pelvic exams and Pap testing every 3 years, or they may recommend the Pap and pelvic exam, combined with testing for human papilloma virus (HPV), every 5 years. Some types of HPV increase your risk of cervical cancer. Testing for HPV may also be done on women of any age with unclear Pap test results.  Other health care providers may not recommend any screening for nonpregnant women who are considered low risk for pelvic cancer and who do not have symptoms. Ask your health care provider if a screening pelvic exam is right for you.  If you have had past treatment for cervical cancer or a condition that could lead to cancer, you need Pap tests and screening for cancer for at least 20 years after your treatment. If Pap tests have been discontinued, your risk factors (such as having a new sexual partner) need to be reassessed to determine if screening should resume. Some women have medical problems that increase the chance of getting cervical cancer. In these cases, your health care provider may recommend more frequent screening and Pap  tests.  Colorectal Cancer  This type of cancer can be detected and often prevented.  Routine colorectal cancer screening usually begins at 57 years of age and continues through 57 years of age.  Your health care provider may recommend screening at an earlier age if you have risk factors for colon cancer.  Your health care provider may also recommend using home test kits to check for hidden blood in the stool.  A small camera at the end of a tube can be used to examine your colon directly (sigmoidoscopy or colonoscopy). This is done to check for the earliest forms of colorectal cancer.  Routine screening usually begins at age 32.  Direct examination of the colon should be repeated every 5-10 years through 57 years of age. However, you may need to be screened more often if early forms of precancerous polyps or small growths are found.  Skin Cancer  Check your skin from head to toe regularly.  Tell your health care provider about any new moles or changes in moles, especially if there is a change in a mole's shape or  color.  Also tell your health care provider if you have a mole that is larger than the size of a pencil eraser.  Always use sunscreen. Apply sunscreen liberally and repeatedly throughout the day.  Protect yourself by wearing long sleeves, pants, a wide-brimmed hat, and sunglasses whenever you are outside.  Heart disease, diabetes, and high blood pressure  High blood pressure causes heart disease and increases the risk of stroke. High blood pressure is more likely to develop in: ? People who have blood pressure in the high end of the normal range (130-139/85-89 mm Hg). ? People who are overweight or obese. ? People who are African American.  If you are 61-36 years of age, have your blood pressure checked every 3-5 years. If you are 18 years of age or older, have your blood pressure checked every year. You should have your blood pressure measured twice-once when you are at  a hospital or clinic, and once when you are not at a hospital or clinic. Record the average of the two measurements. To check your blood pressure when you are not at a hospital or clinic, you can use: ? An automated blood pressure machine at a pharmacy. ? A home blood pressure monitor.  If you are between 36 years and 39 years old, ask your health care provider if you should take aspirin to prevent strokes.  Have regular diabetes screenings. This involves taking a blood sample to check your fasting blood sugar level. ? If you are at a normal weight and have a low risk for diabetes, have this test once every three years after 57 years of age. ? If you are overweight and have a high risk for diabetes, consider being tested at a younger age or more often. Preventing infection Hepatitis B  If you have a higher risk for hepatitis B, you should be screened for this virus. You are considered at high risk for hepatitis B if: ? You were born in a country where hepatitis B is common. Ask your health care provider which countries are considered high risk. ? Your parents were born in a high-risk country, and you have not been immunized against hepatitis B (hepatitis B vaccine). ? You have HIV or AIDS. ? You use needles to inject street drugs. ? You live with someone who has hepatitis B. ? You have had sex with someone who has hepatitis B. ? You get hemodialysis treatment. ? You take certain medicines for conditions, including cancer, organ transplantation, and autoimmune conditions.  Hepatitis C  Blood testing is recommended for: ? Everyone born from [birth year] through 1966. ? Anyone with known risk factors for hepatitis C.  Sexually transmitted infections (STIs)  You should be screened for sexually transmitted infections (STIs) including gonorrhea and chlamydia if: ? You are sexually active and are younger than 57 years of age. ? You are older than 57 years of age and your health care provider tells  you that you are at risk for this type of infection. ? Your sexual activity has changed since you were last screened and you are at an increased risk for chlamydia or gonorrhea. Ask your health care provider if you are at risk.  If you do not have HIV, but are at risk, it may be recommended that you take a prescription medicine daily to prevent HIV infection. This is called pre-exposure prophylaxis (PrEP). You are considered at risk if: ? You are sexually active and do not regularly use condoms or know the HIV  status of your partner(s). ? You take drugs by injection. ? You are sexually active with a partner who has HIV.  Talk with your health care provider about whether you are at high risk of being infected with HIV. If you choose to begin PrEP, you should first be tested for HIV. You should then be tested every 3 months for as long as you are taking PrEP. Pregnancy  If you are premenopausal and you may become pregnant, ask your health care provider about preconception counseling.  If you may become pregnant, take 400 to 800 micrograms (mcg) of folic acid every day.  If you want to prevent pregnancy, talk to your health care provider about birth control (contraception). Osteoporosis and menopause  Osteoporosis is a disease in which the bones lose minerals and strength with aging. This can result in serious bone fractures. Your risk for osteoporosis can be identified using a bone density scan.  If you are 5 years of age or older, or if you are at risk for osteoporosis and fractures, ask your health care provider if you should be screened.  Ask your health care provider whether you should take a calcium or vitamin D supplement to lower your risk for osteoporosis.  Menopause may have certain physical symptoms and risks.  Hormone replacement therapy may reduce some of these symptoms and risks. Talk to your health care provider about whether hormone replacement therapy is right for  you. Follow these instructions at home:  Schedule regular health, dental, and eye exams.  Stay current with your immunizations.  Do not use any tobacco products including cigarettes, chewing tobacco, or electronic cigarettes.  If you are pregnant, do not drink alcohol.  If you are breastfeeding, limit how much and how often you drink alcohol.  Limit alcohol intake to no more than 1 drink per day for nonpregnant women. One drink equals 12 ounces of beer, 5 ounces of wine, or 1 ounces of hard liquor.  Do not use street drugs.  Do not share needles.  Ask your health care provider for help if you need support or information about quitting drugs.  Tell your health care provider if you often feel depressed.  Tell your health care provider if you have ever been abused or do not feel safe at home. This information is not intended to replace advice given to you by your health care provider. Make sure you discuss any questions you have with your health care provider. Document Released: 08/11/2010 Document Revised: 07/04/2015 Document Reviewed: 10/30/2014 Elsevier Interactive Patient Education  [employer].

## 2016-10-07 ENCOUNTER — Ambulatory Visit (INDEPENDENT_AMBULATORY_CARE_PROVIDER_SITE_OTHER): Payer: 59 | Admitting: Psychology

## 2016-10-07 DIAGNOSIS — F4323 Adjustment disorder with mixed anxiety and depressed mood: Secondary | ICD-10-CM

## 2016-10-13 ENCOUNTER — Telehealth: Payer: Self-pay | Admitting: Internal Medicine

## 2016-10-13 NOTE — Telephone Encounter (Signed)
Yes increase hydrochlorothiazide to 25 mg daily - can take 12.5 mg twice daily or 25 mg at once.    Please send in new script

## 2016-10-13 NOTE — Telephone Encounter (Signed)
Patient states Dr. Lawerance BachBurns changed blood pressure medication on last OV.  States BP has still been running high.  Top number running around 130 w/ bottom running around 85-90.   If patient needs to double up on fluid pill would like to know if she can take one in the AM and one in the PM.

## 2016-10-14 MED ORDER — HYDROCHLOROTHIAZIDE 12.5 MG PO TABS: 12.5000 mg | ORAL_TABLET | Freq: Two times a day (BID) | ORAL | 5 refills | Status: DC

## 2016-10-14 NOTE — Telephone Encounter (Signed)
LVM informing pt, rx sent to [facility]

## 2016-10-19 ENCOUNTER — Encounter: Payer: 59 | Admitting: Internal Medicine

## 2016-10-28 ENCOUNTER — Ambulatory Visit (INDEPENDENT_AMBULATORY_CARE_PROVIDER_SITE_OTHER): Payer: BC Managed Care – PPO | Admitting: Psychology

## 2016-10-28 DIAGNOSIS — F4323 Adjustment disorder with mixed anxiety and depressed mood: Secondary | ICD-10-CM | POA: Diagnosis not present

## 2016-10-29 ENCOUNTER — Ambulatory Visit: Payer: 59 | Admitting: Psychology

## 2016-11-06 ENCOUNTER — Other Ambulatory Visit: Payer: Self-pay | Admitting: Internal Medicine

## 2016-11-09 ENCOUNTER — Ambulatory Visit (INDEPENDENT_AMBULATORY_CARE_PROVIDER_SITE_OTHER): Payer: BC Managed Care – PPO | Admitting: Family Medicine

## 2016-11-09 ENCOUNTER — Encounter: Payer: Self-pay | Admitting: Family Medicine

## 2016-11-09 VITALS — BP 100/70 | HR 72 | Temp 97.8°F | Resp 12 | Ht 66.0 in | Wt 180.4 lb

## 2016-11-09 DIAGNOSIS — J309 Allergic rhinitis, unspecified: Secondary | ICD-10-CM

## 2016-11-09 DIAGNOSIS — J069 Acute upper respiratory infection, unspecified: Secondary | ICD-10-CM | POA: Diagnosis not present

## 2016-11-09 MED ORDER — FLUTICASONE PROPIONATE 50 MCG/ACT NA SUSP: 1.0000 | Freq: Two times a day (BID) | NASAL | 0 refills | Status: DC

## 2016-11-09 MED ORDER — DOXYCYCLINE HYCLATE 100 MG PO TABS: 100.0000 mg | ORAL_TABLET | Freq: Two times a day (BID) | ORAL | 0 refills | Status: AC

## 2016-11-09 NOTE — Progress Notes (Signed)
HPI:  ACUTE VISIT  Chief Complaint  Patient presents with  . low grade fever  . green mucous  . congested, cough    [name] is a 56 y.o.female here today with her husband complaining of 3 days of respiratory symptoms.  She is requesting antibiotic treatment because according to patient, she has "always" required antibiotics for similar symptoms in the past.  URI   This is a new problem. The current episode started in the past 7 days. The problem has been unchanged. Associated symptoms include congestion, coughing, a plugged ear sensation, rhinorrhea and a sore throat. Pertinent negatives include no abdominal pain, diarrhea, ear pain, headaches, nausea, neck pain, rash, sneezing, swollen glands, vomiting or wheezing. She has tried acetaminophen for the symptoms. The treatment provided no relief.  Subjective fever.  Productive cough with greenish sputum, she denies hemoptysis.  No Hx of recent travel. + Sick contact, she was in a seminar and some people were coughing. No known insect bite.  Hx of allergies: Yes,history of allergies or rhinitis. She is on OTC Cetirizine 10 mg daily.  OTC medications for this problem: Tylenol and esteem inhalations.    Review of Systems  Constitutional: Positive for fatigue. Negative for activity change and appetite change.  HENT: Positive for congestion, postnasal drip, rhinorrhea, sinus pressure and sore throat. Negative for ear pain, facial swelling, mouth sores, sneezing, trouble swallowing and voice change.   Eyes: Negative for discharge, redness and itching.  Respiratory: Positive for cough. Negative for chest tightness, shortness of breath and wheezing.   Gastrointestinal: Negative for abdominal pain, diarrhea, nausea and vomiting.  Musculoskeletal: Negative for arthralgias, joint swelling, myalgias and neck pain.  Skin: Negative for rash.  Allergic/Immunologic: Positive for environmental allergies.  Neurological:  Negative for syncope, weakness and headaches.  Hematological: Negative for adenopathy. Does not bruise/bleed easily.  Psychiatric/Behavioral: Negative for confusion. The patient is nervous/anxious.       Current Outpatient Prescriptions on File Prior to Visit  Medication Sig Dispense Refill  . amLODipine (NORVASC) 5 MG tablet TAKE 1 TABLET (5 MG TOTAL) BY MOUTH AT BEDTIME. 90 tablet 3  . Ferrous Sulfate (IRON) 325 (65 FE) MG TABS Take 1 tablet by mouth daily.    . hydrochlorothiazide (HYDRODIURIL) 12.5 MG tablet Take 1 tablet (12.5 mg total) by mouth 2 (two) times daily. 30 tablet 5  . hyoscyamine (LEVSIN SL) 0.125 MG SL tablet Place 1 tablet (0.125 mg total) under the tongue every 6 (six) hours as needed. 30 tablet 1  . losartan (COZAAR) 100 MG tablet Take 1 tablet (100 mg total) by mouth daily. 90 tablet 3  . Multiple Vitamin (MULTIVITAMIN) tablet Take 1 tablet by mouth daily.    Marland Kitchen omeprazole (PRILOSEC) 40 MG capsule Take 1 capsule (40 mg total) by mouth 2 (two) times daily. 180 capsule 2  . Probiotic Product (PROBIOTIC DAILY PO) Take by mouth daily.    . vitamin B-12 (CYANOCOBALAMIN) 1000 MCG tablet Take 1,000 mcg by mouth daily.     No current facility-administered medications on file prior to visit.      Past Medical History:  Diagnosis Date  . Arthritis   . Chicken pox   . Gastritis   . Hypertension   . Neuropathy   . Thyroid nodule    Allergies  Allergen Reactions  . Codeine Other (See Comments)    Hot flash, nausea, feels faint    Social History   Social History  . Marital status:  Legally Separated    Spouse name: N/A  . Number of children: 2  . Years of education: N/A   Occupational History  . teacher [employer]   Social History Main Topics  . Smoking status: Never Smoker  . Smokeless tobacco: Never Used  . Alcohol use No  . Drug use: No  . Sexual activity: Not Asked   Other Topics Concern  . None   Social History Narrative  .  None    Vitals:   11/09/16 1657  BP: 100/70  Pulse: 72  Resp: 12  Temp: 97.8 F (36.6 C)  SpO2: 96%   Body mass index is 29.11 kg/m.   Physical Exam  Nursing note and vitals reviewed. Constitutional: She is oriented to person, place, and time. She appears well-developed. She does not appear ill. No distress.  HENT:  Head: Normocephalic and atraumatic.  Right Ear: Tympanic membrane, external ear and ear canal normal.  Left Ear: Tympanic membrane, external ear and ear canal normal.  Nose: Rhinorrhea and septal deviation present. Right sinus exhibits no maxillary sinus tenderness and no frontal sinus tenderness. Left sinus exhibits no maxillary sinus tenderness and no frontal sinus tenderness.  Mouth/Throat: Oropharynx is clear and moist and mucous membranes are normal.  Nasal voice. Hypertrophic turbinates. Postnasal drainage.  Eyes: Pupils are equal, round, and reactive to light. Conjunctivae are normal.  Neck: No edema and no erythema present.  Cardiovascular: Normal rate and regular rhythm.   No murmur heard. Respiratory: Effort normal and breath sounds normal. No respiratory distress.  Lymphadenopathy:       Head (right side): No submandibular adenopathy present.       Head (left side): No submandibular adenopathy present.    She has no cervical adenopathy.  Neurological: She is alert and oriented to person, place, and time. She has normal strength. Gait normal.  Skin: Skin is warm. No rash noted. No erythema.  Psychiatric: Her mood appears anxious.  Well groomed, good eye contact.     ASSESSMENT AND PLAN:   Ms. Kathleen Weber was seen today for low grade fever, green mucous and congested, cough.  Diagnoses and all orders for this visit:  URI, acute  Symptoms suggests a viral etiology, I explained that symptomatic treatment is usually recommended in this case, so I do not think abx is needed at this time. She insists that she is going to need abx because she "always"  does.  I sent Rx for Doxycycline to fill in 7-10 days if symptoms are persistent.  Instructed to monitor for signs of complications, clearly instructed about warning signs. I also explained that cough and nasal congestion can last a few days and sometimes weeks. F/U with PCP as needed.  -     fluticasone (FLONASE) 50 MCG/ACT nasal spray; Place 1 spray into both nostrils 2 (two) times daily.  Allergic rhinitis, unspecified seasonality, unspecified trigger  Problem could be aggravating symptoms. Nasal irrigations with saline. Continue OTC Cetirizine 10 mg And steam inhalations. Follow-up with PCP as needed.   -     fluticasone (FLONASE) 50 MCG/ACT nasal spray; Place 1 spray into both nostrils 2 (two) times daily.  Other orders -     doxycycline (VIBRA-TABS) 100 MG tablet; Take 1 tablet (100 mg total) by mouth 2 (two) times daily.    -Ms. Kathleen Weber advised to seek attention immediately if symptoms worsen or to follow if they persist or new concerns arise.       Timoteo Ex[employer]  [place], [place]. [place] office.

## 2016-11-09 NOTE — Patient Instructions (Signed)
  [name] I have seen you today for an acute visit.  A few things to remember from today's visit:   URI, acute - Plan: fluticasone (FLONASE) 50 MCG/ACT nasal spray  Allergic rhinitis, unspecified seasonality, unspecified trigger - Plan: fluticasone (FLONASE) 50 MCG/ACT nasal spray   Medications prescribed today are intended for short period of time and will not be refill upon request, a follow up appointment might be necessary to discuss continuation of of treatment if appropriate.   viral infections are self-limited and we treat each symptom depending of severity.  Over the counter medications as decongestants and cold medications usually help, they need to be taken with caution if there is a history of high blood pressure or palpitations. Tylenol and/or Ibuprofen also helps with most symptoms (headache, muscle aching, fever,etc) Plenty of fluids. Honey helps with cough. Steam inhalations helps with runny nose, nasal congestion, and may prevent sinus infections. Cough and nasal congestion could last a few days and sometimes weeks. Please follow in not any better in 2 weeks or if symptoms get worse.   In general please monitor for signs of worsening symptoms and seek immediate medical attention if any concerning.  If symptoms are not resolved in 2 weeks you should schedule a follow up appointment with your doctor, before if needed.  I hope you get better soon!

## 2016-11-12 ENCOUNTER — Other Ambulatory Visit: Payer: Self-pay | Admitting: Emergency Medicine

## 2016-11-12 MED ORDER — HYDROCHLOROTHIAZIDE 12.5 MG PO CAPS: 12.5000 mg | ORAL_CAPSULE | Freq: Two times a day (BID) | ORAL | 1 refills | Status: DC

## 2016-12-02 ENCOUNTER — Ambulatory Visit (INDEPENDENT_AMBULATORY_CARE_PROVIDER_SITE_OTHER): Payer: BC Managed Care – PPO | Admitting: Psychology

## 2016-12-02 DIAGNOSIS — F4323 Adjustment disorder with mixed anxiety and depressed mood: Secondary | ICD-10-CM

## 2016-12-10 ENCOUNTER — Encounter: Payer: Self-pay | Admitting: Internal Medicine

## 2016-12-13 ENCOUNTER — Other Ambulatory Visit: Payer: Self-pay | Admitting: Family Medicine

## 2016-12-13 DIAGNOSIS — J309 Allergic rhinitis, unspecified: Secondary | ICD-10-CM

## 2016-12-13 DIAGNOSIS — J069 Acute upper respiratory infection, unspecified: Secondary | ICD-10-CM

## 2016-12-15 NOTE — Telephone Encounter (Signed)
Please advise if you are will to take on these pts.

## 2016-12-16 ENCOUNTER — Ambulatory Visit (INDEPENDENT_AMBULATORY_CARE_PROVIDER_SITE_OTHER): Payer: BC Managed Care – PPO | Admitting: Psychology

## 2016-12-16 DIAGNOSIS — F4323 Adjustment disorder with mixed anxiety and depressed mood: Secondary | ICD-10-CM | POA: Diagnosis not present

## 2016-12-24 ENCOUNTER — Ambulatory Visit (INDEPENDENT_AMBULATORY_CARE_PROVIDER_SITE_OTHER): Payer: BC Managed Care – PPO | Admitting: Psychology

## 2016-12-24 DIAGNOSIS — F4323 Adjustment disorder with mixed anxiety and depressed mood: Secondary | ICD-10-CM

## 2016-12-28 ENCOUNTER — Ambulatory Visit (INDEPENDENT_AMBULATORY_CARE_PROVIDER_SITE_OTHER): Payer: BC Managed Care – PPO | Admitting: Psychology

## 2016-12-28 ENCOUNTER — Encounter: Payer: Self-pay | Admitting: Internal Medicine

## 2016-12-28 DIAGNOSIS — F4323 Adjustment disorder with mixed anxiety and depressed mood: Secondary | ICD-10-CM

## 2016-12-30 ENCOUNTER — Ambulatory Visit: Payer: BC Managed Care – PPO | Admitting: Psychology

## 2017-01-07 LAB — HM MAMMOGRAPHY

## 2017-01-13 ENCOUNTER — Ambulatory Visit (INDEPENDENT_AMBULATORY_CARE_PROVIDER_SITE_OTHER): Payer: BC Managed Care – PPO | Admitting: Psychology

## 2017-01-13 DIAGNOSIS — F4323 Adjustment disorder with mixed anxiety and depressed mood: Secondary | ICD-10-CM

## 2017-01-27 ENCOUNTER — Ambulatory Visit (INDEPENDENT_AMBULATORY_CARE_PROVIDER_SITE_OTHER): Payer: BC Managed Care – PPO | Admitting: Psychology

## 2017-01-27 DIAGNOSIS — F4323 Adjustment disorder with mixed anxiety and depressed mood: Secondary | ICD-10-CM | POA: Diagnosis not present

## 2017-02-10 ENCOUNTER — Ambulatory Visit: Payer: Self-pay | Admitting: Psychology

## 2017-02-12 ENCOUNTER — Ambulatory Visit (INDEPENDENT_AMBULATORY_CARE_PROVIDER_SITE_OTHER): Payer: BC Managed Care – PPO | Admitting: Psychology

## 2017-02-12 DIAGNOSIS — F4323 Adjustment disorder with mixed anxiety and depressed mood: Secondary | ICD-10-CM | POA: Diagnosis not present

## 2017-02-24 ENCOUNTER — Ambulatory Visit (INDEPENDENT_AMBULATORY_CARE_PROVIDER_SITE_OTHER): Payer: BC Managed Care – PPO | Admitting: Psychology

## 2017-02-24 DIAGNOSIS — F4323 Adjustment disorder with mixed anxiety and depressed mood: Secondary | ICD-10-CM

## 2017-03-10 ENCOUNTER — Ambulatory Visit: Payer: Self-pay | Admitting: Psychology

## 2017-03-24 ENCOUNTER — Ambulatory Visit (INDEPENDENT_AMBULATORY_CARE_PROVIDER_SITE_OTHER): Payer: BC Managed Care – PPO | Admitting: Psychology

## 2017-03-24 DIAGNOSIS — F4323 Adjustment disorder with mixed anxiety and depressed mood: Secondary | ICD-10-CM

## 2017-04-07 ENCOUNTER — Ambulatory Visit: Payer: Self-pay | Admitting: Psychology

## 2017-04-15 ENCOUNTER — Telehealth: Payer: Self-pay | Admitting: Internal Medicine

## 2017-04-15 NOTE — Telephone Encounter (Signed)
Patient came by the office and dropped off a form to be completed for her insurance. Given to [place].

## 2017-04-16 NOTE — Telephone Encounter (Signed)
Completed & mailed to [employer] &patient. Patient informed.   Copy sent to scan.

## 2017-04-16 NOTE — Telephone Encounter (Signed)
Form has been completed & placed in providers box to review and sign.  °

## 2017-04-16 NOTE — Telephone Encounter (Signed)
signed

## 2017-04-21 ENCOUNTER — Ambulatory Visit (INDEPENDENT_AMBULATORY_CARE_PROVIDER_SITE_OTHER): Payer: BC Managed Care – PPO | Admitting: Psychology

## 2017-04-21 DIAGNOSIS — F4323 Adjustment disorder with mixed anxiety and depressed mood: Secondary | ICD-10-CM | POA: Diagnosis not present

## 2017-05-05 ENCOUNTER — Ambulatory Visit: Payer: BC Managed Care – PPO | Admitting: Psychology

## 2017-05-06 ENCOUNTER — Other Ambulatory Visit: Payer: Self-pay | Admitting: Internal Medicine

## 2017-05-19 ENCOUNTER — Ambulatory Visit (INDEPENDENT_AMBULATORY_CARE_PROVIDER_SITE_OTHER): Payer: BC Managed Care – PPO | Admitting: Psychology

## 2017-05-19 DIAGNOSIS — F4323 Adjustment disorder with mixed anxiety and depressed mood: Secondary | ICD-10-CM

## 2017-05-26 ENCOUNTER — Ambulatory Visit (INDEPENDENT_AMBULATORY_CARE_PROVIDER_SITE_OTHER): Payer: BC Managed Care – PPO | Admitting: Psychology

## 2017-05-26 DIAGNOSIS — F4323 Adjustment disorder with mixed anxiety and depressed mood: Secondary | ICD-10-CM

## 2017-06-02 ENCOUNTER — Ambulatory Visit (INDEPENDENT_AMBULATORY_CARE_PROVIDER_SITE_OTHER): Payer: BC Managed Care – PPO | Admitting: Psychology

## 2017-06-02 DIAGNOSIS — F4323 Adjustment disorder with mixed anxiety and depressed mood: Secondary | ICD-10-CM

## 2017-06-16 ENCOUNTER — Ambulatory Visit: Payer: Self-pay | Admitting: Psychology

## 2017-06-23 ENCOUNTER — Ambulatory Visit: Payer: BC Managed Care – PPO | Admitting: Psychology

## 2017-06-23 DIAGNOSIS — F4323 Adjustment disorder with mixed anxiety and depressed mood: Secondary | ICD-10-CM

## 2017-06-30 ENCOUNTER — Ambulatory Visit: Payer: Self-pay | Admitting: Psychology

## 2017-07-20 ENCOUNTER — Ambulatory Visit (INDEPENDENT_AMBULATORY_CARE_PROVIDER_SITE_OTHER): Payer: BC Managed Care – PPO | Admitting: Psychology

## 2017-07-20 DIAGNOSIS — F4323 Adjustment disorder with mixed anxiety and depressed mood: Secondary | ICD-10-CM

## 2017-07-28 ENCOUNTER — Ambulatory Visit (INDEPENDENT_AMBULATORY_CARE_PROVIDER_SITE_OTHER): Payer: BC Managed Care – PPO | Admitting: Psychology

## 2017-07-28 DIAGNOSIS — F4323 Adjustment disorder with mixed anxiety and depressed mood: Secondary | ICD-10-CM

## 2017-08-03 ENCOUNTER — Other Ambulatory Visit: Payer: Self-pay | Admitting: Internal Medicine

## 2017-08-09 ENCOUNTER — Other Ambulatory Visit: Payer: Self-pay | Admitting: Internal Medicine

## 2017-08-11 ENCOUNTER — Ambulatory Visit: Payer: BC Managed Care – PPO | Admitting: Psychology

## 2017-08-16 ENCOUNTER — Ambulatory Visit: Payer: BC Managed Care – PPO | Admitting: Psychology

## 2017-08-20 ENCOUNTER — Ambulatory Visit (INDEPENDENT_AMBULATORY_CARE_PROVIDER_SITE_OTHER): Payer: BC Managed Care – PPO | Admitting: Psychology

## 2017-08-20 DIAGNOSIS — F4323 Adjustment disorder with mixed anxiety and depressed mood: Secondary | ICD-10-CM

## 2017-08-25 ENCOUNTER — Ambulatory Visit: Payer: BC Managed Care – PPO | Admitting: Psychology

## 2017-09-08 ENCOUNTER — Ambulatory Visit (INDEPENDENT_AMBULATORY_CARE_PROVIDER_SITE_OTHER): Payer: BC Managed Care – PPO | Admitting: Psychology

## 2017-09-08 ENCOUNTER — Ambulatory Visit: Payer: BC Managed Care – PPO | Admitting: Psychology

## 2017-09-08 DIAGNOSIS — F4323 Adjustment disorder with mixed anxiety and depressed mood: Secondary | ICD-10-CM

## 2017-09-10 ENCOUNTER — Ambulatory Visit (INDEPENDENT_AMBULATORY_CARE_PROVIDER_SITE_OTHER): Payer: BC Managed Care – PPO | Admitting: Psychology

## 2017-09-10 DIAGNOSIS — F4323 Adjustment disorder with mixed anxiety and depressed mood: Secondary | ICD-10-CM | POA: Diagnosis not present

## 2017-09-17 ENCOUNTER — Ambulatory Visit: Payer: BC Managed Care – PPO | Admitting: Psychology

## 2017-09-22 ENCOUNTER — Ambulatory Visit: Payer: Self-pay | Admitting: Psychology

## 2017-10-06 ENCOUNTER — Ambulatory Visit (INDEPENDENT_AMBULATORY_CARE_PROVIDER_SITE_OTHER): Payer: BC Managed Care – PPO | Admitting: Psychology

## 2017-10-06 DIAGNOSIS — F4323 Adjustment disorder with mixed anxiety and depressed mood: Secondary | ICD-10-CM

## 2017-10-19 ENCOUNTER — Telehealth: Payer: Self-pay | Admitting: Internal Medicine

## 2017-10-19 DIAGNOSIS — Z833 Family history of diabetes mellitus: Secondary | ICD-10-CM | POA: Insufficient documentation

## 2017-10-19 DIAGNOSIS — I10 Essential (primary) hypertension: Secondary | ICD-10-CM

## 2017-10-19 DIAGNOSIS — R739 Hyperglycemia, unspecified: Secondary | ICD-10-CM

## 2017-10-19 DIAGNOSIS — E041 Nontoxic single thyroid nodule: Secondary | ICD-10-CM

## 2017-10-19 DIAGNOSIS — E782 Mixed hyperlipidemia: Secondary | ICD-10-CM

## 2017-10-19 NOTE — Telephone Encounter (Signed)
Blood work ordered.

## 2017-10-19 NOTE — Telephone Encounter (Signed)
Pt aware.

## 2017-10-19 NOTE — Telephone Encounter (Signed)
Copied from CRM [id number]. Topic: Inquiry >> Oct 19, 2017  1:15 PM Tamela Oddi, NT wrote: Reason for CRM: Patient called and suggested that she would her CPE blood work done before her appt on 10/22/17, please call patient to schedule. She also wants her blood sugar checked due to a family HX of diabetes. CB# [phone]

## 2017-10-19 NOTE — Telephone Encounter (Signed)
Please advise 

## 2017-10-20 ENCOUNTER — Ambulatory Visit: Payer: Self-pay | Admitting: Psychology

## 2017-10-21 NOTE — Progress Notes (Signed)
Subjective:    Patient ID: Kathleen Weber, female    DOB: 11/02/1960, 57 y.o.   MRN: [id number]  HPI She is here for a physical exam.   She feels good and has no concerns.  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   She has no concerns.   She has had a prescription for flonase and promethazine- DM cough syrup and would like refills - she uses them only as needed.    Medications and allergies reviewed with patient and updated if appropriate.  Patient Active Problem List   Diagnosis Date Noted  . Family history of diabetes mellitus 10/19/2017  . Hyperglycemia 10/01/2016  . Carpal tunnel syndrome, bilateral 02/22/2016  . IBS (irritable bowel syndrome) 08/05/2015  . Colitis 05/15/2015  . Hyperlipidemia 05/15/2015  . Hypertension 12/19/2012  . Allergic rhinitis 12/19/2012  . Thyroid nodule   . Bilateral hand numbness   . Overweight     Current Outpatient Medications on File Prior to Visit  Medication Sig Dispense Refill  . amLODipine (NORVASC) 5 MG tablet TAKE 1 TABLET (5 MG TOTAL) BY MOUTH AT BEDTIME. 90 tablet 3  . Biotin 5 MG CAPS Take by mouth.    . Ferrous Sulfate (IRON) 325 (65 FE) MG TABS Take 1 tablet by mouth daily.    . fluticasone (FLONASE) 50 MCG/ACT nasal spray USE 1 SPRAY IN EACH NOSTRIL TWICE A DAY 16 g 0  . hydrochlorothiazide (MICROZIDE) 12.5 MG capsule Take 1 capsule (12.5 mg total) by mouth 2 (two) times daily. -- Office visit needed for further refills 180 capsule 0  . hyoscyamine (LEVSIN SL) 0.125 MG SL tablet Place 1 tablet (0.125 mg total) under the tongue every 6 (six) hours as needed. 30 tablet 1  . losartan (COZAAR) 100 MG tablet Take 1 tablet (100 mg total) by mouth daily. -- Office visit needed for further refills 90 tablet 0  . Multiple Vitamin (MULTIVITAMIN) tablet Take 1 tablet by mouth daily.    Marland Kitchen. omeprazole (PRILOSEC) 40 MG capsule Take 1 capsule (40 mg total) by mouth  2 (two) times daily. 180 capsule 2  . Probiotic Product (PROBIOTIC DAILY PO) Take by mouth daily.    . vitamin B-12 (CYANOCOBALAMIN) 1000 MCG tablet Take 1,000 mcg by mouth daily.     No current facility-administered medications on file prior to visit.     Past Medical History:  Diagnosis Date  . Arthritis   . Chicken pox   . Gastritis   . Hypertension   . Neuropathy   . Thyroid nodule     Past Surgical History:  Procedure Laterality Date  . neg hx      Social History   Socioeconomic History  . Marital status: Legally Separated    Spouse name: Not on file  . Number of children: 2  . Years of education: Not on file  . Highest education level: Not on file  Occupational History  . Occupation: Magazine features editorteacher    Employer: [employer]  Social Needs  . Financial resource strain: Not on file  . Food insecurity:    Worry: Not on file    Inability: Not on file  . Transportation needs:    Medical: Not on file    Non-medical: Not on file  Tobacco Use  . Smoking status: Never Smoker  . Smokeless tobacco: Never Used  Substance and Sexual Activity  .  Alcohol use: No  . Drug use: No  . Sexual activity: Not on file  Lifestyle  . Physical activity:    Days per week: Not on file    Minutes per session: Not on file  . Stress: Not on file  Relationships  . Social connections:    Talks on phone: Not on file    Gets together: Not on file    Attends religious service: Not on file    Active member of club or organization: Not on file    Attends meetings of clubs or organizations: Not on file    Relationship status: Not on file  Other Topics Concern  . Not on file  Social History Narrative  . Not on file    Family History  Problem Relation Age of Onset  . Stomach cancer Mother   . Heart disease Mother   . Kidney disease Mother        kidney cancer  . Hypertension Mother   . Diabetes Father   . Coronary artery disease Father 21       CABGx5  . Stroke  Father        TIAs  . Hypertension Father   . Lymphoma Maternal Grandmother   . Colon cancer Neg Hx     Review of Systems  Constitutional: Negative for chills and fever.  Eyes: Negative for visual disturbance.  Respiratory: Negative for cough, shortness of breath and wheezing.   Cardiovascular: Positive for palpitations (or heart racing occasionally ). Negative for chest pain and leg swelling.  Gastrointestinal: Negative for abdominal pain, blood in stool, constipation, diarrhea and nausea.       No gerd   Genitourinary: Negative for dysuria and hematuria.  Musculoskeletal: Negative for arthralgias and back pain.  Skin: Negative for color change and rash.  Neurological: Negative for dizziness, light-headedness, numbness and headaches.  Psychiatric/Behavioral: Negative for dysphoric mood and sleep disturbance. The patient is not nervous/anxious.        Objective:   Vitals:   10/22/17 0808  BP: 118/70  Pulse: 83  Resp: 16  Temp: 98.4 F (36.9 C)  SpO2: 96%   Filed Weights   10/22/17 0808  Weight: 174 lb (78.9 kg)   Body mass index is 28.08 kg/m.  Wt Readings from Last 3 Encounters:  10/22/17 174 lb (78.9 kg)  11/09/16 180 lb 6 oz (81.8 kg)  10/01/16 179 lb (81.2 kg)     Physical Exam Constitutional: She appears well-developed and well-nourished. No distress.  HENT:  Head: Normocephalic and atraumatic.  Right Ear: External ear normal. Normal ear canal and TM Left Ear: External ear normal.  Normal ear canal and TM Mouth/Throat: Oropharynx is clear and moist.  Eyes: Conjunctivae and EOM are normal.  Neck: Neck supple. No tracheal deviation present. No thyromegaly present.  No carotid bruit  Cardiovascular: Normal rate, regular rhythm and normal heart sounds.   No murmur heard.  No edema. Pulmonary/Chest: Effort normal and breath sounds normal. No respiratory distress. She has no wheezes. She has no rales.  Breast: deferred to Gyn Abdominal: Soft. She exhibits no  distension. There is no tenderness.  Lymphadenopathy: She has no cervical adenopathy.  Skin: Skin is warm and dry. She is not diaphoretic.  Psychiatric: She has a normal mood and affect. Her behavior is normal.        Assessment & Plan:   Physical exam: Screening blood work  Drawn today - pending Immunizations   flu vaccine deferred, discussed shingles  vaccine Colonoscopy    up-to-date Mammogram  Up to date - will get report Gyn   up-to-date Eye exams   Up to date  EKG   none on file Exercise  Overweight - working on weight loss Weight goes to gym - trainer 2/ week Skin   No concerns Substance abuse   none  See Problem List for Assessment and Plan of chronic medical problems.     FU in one year

## 2017-10-21 NOTE — Patient Instructions (Addendum)
All other Health Maintenance issues reviewed.   All recommended immunizations and age-appropriate screenings are up-to-date or discussed.  No immunizations administered today.   Medications reviewed and updated.  No changes recommended at this time.  Your prescription(s) have been submitted to your pharmacy. Please take as directed and contact our office if you believe you are having problem(s) with the medication(s).  A referral was ordered for cardiology  Please followup in 1 year for a physical   Health Maintenance, Female Adopting a healthy lifestyle and getting preventive care can go a long way to promote health and wellness. Talk with your health care provider about what schedule of regular examinations is right for you. This is a good chance for you to check in with your provider about disease prevention and staying healthy. In between checkups, there are plenty of things you can do on your own. Experts have done a lot of research about which lifestyle changes and preventive measures are most likely to keep you healthy. Ask your health care provider for more information. Weight and diet Eat a healthy diet  Be sure to include plenty of vegetables, fruits, low-fat dairy products, and lean protein.  Do not eat a lot of foods high in solid fats, added sugars, or salt.  Get regular exercise. This is one of the most important things you can do for your health. ? Most adults should exercise for at least 150 minutes each week. The exercise should increase your heart rate and make you sweat (moderate-intensity exercise). ? Most adults should also do strengthening exercises at least twice a week. This is in addition to the moderate-intensity exercise.  Maintain a healthy weight  Body mass index (BMI) is a measurement that can be used to identify possible weight problems. It estimates body fat based on height and weight. Your health care provider can help determine your BMI and help you  achieve or maintain a healthy weight.  For females 59 years of age and older: ? A BMI below 18.5 is considered underweight. ? A BMI of 18.5 to 24.9 is normal. ? A BMI of 25 to 29.9 is considered overweight. ? A BMI of 30 and above is considered obese.  Watch levels of cholesterol and blood lipids  You should start having your blood tested for lipids and cholesterol at 58 years of age, then have this test every 5 years.  You may need to have your cholesterol levels checked more often if: ? Your lipid or cholesterol levels are high. ? You are older than 59 years of age. ? You are at high risk for heart disease.  Cancer screening Lung Cancer  Lung cancer screening is recommended for adults 69-65 years old who are at high risk for lung cancer because of a history of smoking.  A yearly low-dose CT scan of the lungs is recommended for people who: ? Currently smoke. ? Have quit within the past 15 years. ? Have at least a 30-pack-year history of smoking. A pack year is smoking an average of one pack of cigarettes a day for 1 year.  Yearly screening should continue until it has been 15 years since you quit.  Yearly screening should stop if you develop a health problem that would prevent you from having lung cancer treatment.  Breast Cancer  Practice breast self-awareness. This means understanding how your breasts normally appear and feel.  It also means doing regular breast self-exams. Let your health care provider know about any changes, no matter  how small.  If you are in your 20s or 30s, you should have a clinical breast exam (CBE) by a health care provider every 1-3 years as part of a regular health exam.  If you are 50 or older, have a CBE every year. Also consider having a breast X-ray (mammogram) every year.  If you have a family history of breast cancer, talk to your health care provider about genetic screening.  If you are at high risk for breast cancer, talk to your health  care provider about having an MRI and a mammogram every year.  Breast cancer gene (BRCA) assessment is recommended for women who have family members with BRCA-related cancers. BRCA-related cancers include: ? Breast. ? Ovarian. ? Tubal. ? Peritoneal cancers.  Results of the assessment will determine the need for genetic counseling and BRCA1 and BRCA2 testing.  Cervical Cancer Your health care provider may recommend that you be screened regularly for cancer of the pelvic organs (ovaries, uterus, and vagina). This screening involves a pelvic examination, including checking for microscopic changes to the surface of your cervix (Pap test). You may be encouraged to have this screening done every 3 years, beginning at age 28.  For women ages 22-65, health care providers may recommend pelvic exams and Pap testing every 3 years, or they may recommend the Pap and pelvic exam, combined with testing for human papilloma virus (HPV), every 5 years. Some types of HPV increase your risk of cervical cancer. Testing for HPV may also be done on women of any age with unclear Pap test results.  Other health care providers may not recommend any screening for nonpregnant women who are considered low risk for pelvic cancer and who do not have symptoms. Ask your health care provider if a screening pelvic exam is right for you.  If you have had past treatment for cervical cancer or a condition that could lead to cancer, you need Pap tests and screening for cancer for at least 20 years after your treatment. If Pap tests have been discontinued, your risk factors (such as having a new sexual partner) need to be reassessed to determine if screening should resume. Some women have medical problems that increase the chance of getting cervical cancer. In these cases, your health care provider may recommend more frequent screening and Pap tests.  Colorectal Cancer  This type of cancer can be detected and often  prevented.  Routine colorectal cancer screening usually begins at 59 years of age and continues through 58 years of age.  Your health care provider may recommend screening at an earlier age if you have risk factors for colon cancer.  Your health care provider may also recommend using home test kits to check for hidden blood in the stool.  A small camera at the end of a tube can be used to examine your colon directly (sigmoidoscopy or colonoscopy). This is done to check for the earliest forms of colorectal cancer.  Routine screening usually begins at age 27.  Direct examination of the colon should be repeated every 5-10 years through 58 years of age. However, you may need to be screened more often if early forms of precancerous polyps or small growths are found.  Skin Cancer  Check your skin from head to toe regularly.  Tell your health care provider about any new moles or changes in moles, especially if there is a change in a mole's shape or color.  Also tell your health care provider if you have a  mole that is larger than the size of a pencil eraser.  Always use sunscreen. Apply sunscreen liberally and repeatedly throughout the day.  Protect yourself by wearing long sleeves, pants, a wide-brimmed hat, and sunglasses whenever you are outside.  Heart disease, diabetes, and high blood pressure  High blood pressure causes heart disease and increases the risk of stroke. High blood pressure is more likely to develop in: ? People who have blood pressure in the high end of the normal range (130-139/85-89 mm Hg). ? People who are overweight or obese. ? People who are African American.  If you are 25-40 years of age, have your blood pressure checked every 3-5 years. If you are 10 years of age or older, have your blood pressure checked every year. You should have your blood pressure measured twice-once when you are at a hospital or clinic, and once when you are not at a hospital or clinic.  Record the average of the two measurements. To check your blood pressure when you are not at a hospital or clinic, you can use: ? An automated blood pressure machine at a pharmacy. ? A home blood pressure monitor.  If you are between 68 years and 25 years old, ask your health care provider if you should take aspirin to prevent strokes.  Have regular diabetes screenings. This involves taking a blood sample to check your fasting blood sugar level. ? If you are at a normal weight and have a low risk for diabetes, have this test once every three years after 58 years of age. ? If you are overweight and have a high risk for diabetes, consider being tested at a younger age or more often. Preventing infection Hepatitis B  If you have a higher risk for hepatitis B, you should be screened for this virus. You are considered at high risk for hepatitis B if: ? You were born in a country where hepatitis B is common. Ask your health care provider which countries are considered high risk. ? Your parents were born in a high-risk country, and you have not been immunized against hepatitis B (hepatitis B vaccine). ? You have HIV or AIDS. ? You use needles to inject street drugs. ? You live with someone who has hepatitis B. ? You have had sex with someone who has hepatitis B. ? You get hemodialysis treatment. ? You take certain medicines for conditions, including cancer, organ transplantation, and autoimmune conditions.  Hepatitis C  Blood testing is recommended for: ? Everyone born from [birth year] through 1966. ? Anyone with known risk factors for hepatitis C.  Sexually transmitted infections (STIs)  You should be screened for sexually transmitted infections (STIs) including gonorrhea and chlamydia if: ? You are sexually active and are younger than 58 years of age. ? You are older than 58 years of age and your health care provider tells you that you are at risk for this type of infection. ? Your sexual  activity has changed since you were last screened and you are at an increased risk for chlamydia or gonorrhea. Ask your health care provider if you are at risk.  If you do not have HIV, but are at risk, it may be recommended that you take a prescription medicine daily to prevent HIV infection. This is called pre-exposure prophylaxis (PrEP). You are considered at risk if: ? You are sexually active and do not regularly use condoms or know the HIV status of your partner(s). ? You take drugs by injection. ? You  are sexually active with a partner who has HIV.  Talk with your health care provider about whether you are at high risk of being infected with HIV. If you choose to begin PrEP, you should first be tested for HIV. You should then be tested every 3 months for as long as you are taking PrEP. Pregnancy  If you are premenopausal and you may become pregnant, ask your health care provider about preconception counseling.  If you may become pregnant, take 400 to 800 micrograms (mcg) of folic acid every day.  If you want to prevent pregnancy, talk to your health care provider about birth control (contraception). Osteoporosis and menopause  Osteoporosis is a disease in which the bones lose minerals and strength with aging. This can result in serious bone fractures. Your risk for osteoporosis can be identified using a bone density scan.  If you are 61 years of age or older, or if you are at risk for osteoporosis and fractures, ask your health care provider if you should be screened.  Ask your health care provider whether you should take a calcium or vitamin D supplement to lower your risk for osteoporosis.  Menopause may have certain physical symptoms and risks.  Hormone replacement therapy may reduce some of these symptoms and risks. Talk to your health care provider about whether hormone replacement therapy is right for you. Follow these instructions at home:  Schedule regular health, dental,  and eye exams.  Stay current with your immunizations.  Do not use any tobacco products including cigarettes, chewing tobacco, or electronic cigarettes.  If you are pregnant, do not drink alcohol.  If you are breastfeeding, limit how much and how often you drink alcohol.  Limit alcohol intake to no more than 1 drink per day for nonpregnant women. One drink equals 12 ounces of beer, 5 ounces of wine, or 1 ounces of hard liquor.  Do not use street drugs.  Do not share needles.  Ask your health care provider for help if you need support or information about quitting drugs.  Tell your health care provider if you often feel depressed.  Tell your health care provider if you have ever been abused or do not feel safe at home. This information is not intended to replace advice given to you by your health care provider. Make sure you discuss any questions you have with your health care provider. Document Released: 08/11/2010 Document Revised: 07/04/2015 Document Reviewed: 10/30/2014 Elsevier Interactive Patient Education  [employer].

## 2017-10-22 ENCOUNTER — Other Ambulatory Visit (INDEPENDENT_AMBULATORY_CARE_PROVIDER_SITE_OTHER): Payer: BC Managed Care – PPO

## 2017-10-22 ENCOUNTER — Encounter: Payer: Self-pay | Admitting: Internal Medicine

## 2017-10-22 ENCOUNTER — Ambulatory Visit (INDEPENDENT_AMBULATORY_CARE_PROVIDER_SITE_OTHER): Payer: BC Managed Care – PPO | Admitting: Internal Medicine

## 2017-10-22 VITALS — BP 118/70 | HR 83 | Temp 98.4°F | Resp 16 | Ht 66.0 in | Wt 174.0 lb

## 2017-10-22 DIAGNOSIS — I10 Essential (primary) hypertension: Secondary | ICD-10-CM | POA: Diagnosis not present

## 2017-10-22 DIAGNOSIS — Z0000 Encounter for general adult medical examination without abnormal findings: Secondary | ICD-10-CM | POA: Diagnosis not present

## 2017-10-22 DIAGNOSIS — J309 Allergic rhinitis, unspecified: Secondary | ICD-10-CM

## 2017-10-22 DIAGNOSIS — R739 Hyperglycemia, unspecified: Secondary | ICD-10-CM

## 2017-10-22 DIAGNOSIS — E782 Mixed hyperlipidemia: Secondary | ICD-10-CM

## 2017-10-22 DIAGNOSIS — R000 Tachycardia, unspecified: Secondary | ICD-10-CM

## 2017-10-22 DIAGNOSIS — Z833 Family history of diabetes mellitus: Secondary | ICD-10-CM

## 2017-10-22 DIAGNOSIS — E663 Overweight: Secondary | ICD-10-CM

## 2017-10-22 DIAGNOSIS — E041 Nontoxic single thyroid nodule: Secondary | ICD-10-CM

## 2017-10-22 LAB — CBC WITH DIFFERENTIAL/PLATELET
Basophils Absolute: 0.1 10*3/uL (ref 0.0–0.1)
Basophils Relative: 1.2 % (ref 0.0–3.0)
EOS PCT: 1.2 % (ref 0.0–5.0)
Eosinophils Absolute: 0.1 10*3/uL (ref 0.0–0.7)
HEMATOCRIT: 40.8 % (ref 36.0–46.0)
Hemoglobin: 14.5 g/dL (ref 12.0–15.0)
LYMPHS ABS: 1.9 10*3/uL (ref 0.7–4.0)
Lymphocytes Relative: 40.9 % (ref 12.0–46.0)
MCHC: 35.6 g/dL (ref 30.0–36.0)
MCV: 83.1 fl (ref 78.0–100.0)
MONOS PCT: 8.8 % (ref 3.0–12.0)
Monocytes Absolute: 0.4 10*3/uL (ref 0.1–1.0)
NEUTROS PCT: 47.9 % (ref 43.0–77.0)
Neutro Abs: 2.2 10*3/uL (ref 1.4–7.7)
Platelets: 200 10*3/uL (ref 150.0–400.0)
RBC: 4.9 Mil/uL (ref 3.87–5.11)
RDW: 12.5 % (ref 11.5–15.5)
WBC: 4.5 10*3/uL (ref 4.0–10.5)

## 2017-10-22 LAB — LIPID PANEL
Cholesterol: 180 mg/dL (ref 0–200)
HDL: 50.3 mg/dL (ref >39.00)
LDL Cholesterol: 120 mg/dL — ABNORMAL HIGH (ref 0–99)
NONHDL: 129.76
Total CHOL/HDL Ratio: 4
Triglycerides: 50 mg/dL (ref 0.0–149.0)
VLDL: 10 mg/dL (ref 0.0–40.0)

## 2017-10-22 LAB — COMPREHENSIVE METABOLIC PANEL
ALK PHOS: 47 U/L (ref 39–117)
ALT: 13 U/L (ref 0–35)
AST: 19 U/L (ref 0–37)
Albumin: 4.2 g/dL (ref 3.5–5.2)
BUN: 13 mg/dL (ref 6–23)
CO2: 29 mEq/L (ref 19–32)
Calcium: 9.4 mg/dL (ref 8.4–10.5)
Chloride: 99 mEq/L (ref 96–112)
Creatinine, Ser: 0.73 mg/dL (ref 0.40–1.20)
GFR: 87.09 mL/min (ref >60.00)
GLUCOSE: 104 mg/dL — AB (ref 70–99)
POTASSIUM: 3.7 meq/L (ref 3.5–5.1)
Sodium: 138 mEq/L (ref 135–145)
TOTAL PROTEIN: 7.4 g/dL (ref 6.0–8.3)
Total Bilirubin: 0.6 mg/dL (ref 0.2–1.2)

## 2017-10-22 LAB — TSH: TSH: 1.04 u[IU]/mL (ref 0.35–4.50)

## 2017-10-22 LAB — HEMOGLOBIN A1C: Hgb A1c MFr Bld: 5.8 % (ref 4.6–6.5)

## 2017-10-22 MED ORDER — PROMETHAZINE-DM 6.25-15 MG/5ML PO SYRP: 5.0000 mL | ORAL_SOLUTION | Freq: Four times a day (QID) | ORAL | 0 refills | Status: DC | PRN

## 2017-10-22 MED ORDER — FLUTICASONE PROPIONATE 50 MCG/ACT NA SUSP: 1.0000 | Freq: Two times a day (BID) | NASAL | 5 refills | Status: DC

## 2017-10-22 NOTE — Assessment & Plan Note (Addendum)
Has intermittent heart racing - has associated it with eating too much late at night Used to see cardio in the past and would like to establish with cardio - dad had CAD Will refer to cardio - blood work today, will hold off on EKG and let cardio do it

## 2017-10-22 NOTE — Assessment & Plan Note (Signed)
Check a1c Low sugar / carb diet Stressed regular exercise   

## 2017-10-22 NOTE — Assessment & Plan Note (Signed)
BP well controlled Current regimen effective and well tolerated Continue current medications at current doses cmp  

## 2017-10-22 NOTE — Assessment & Plan Note (Signed)
Exercising Working on weight loss 

## 2017-10-22 NOTE — Assessment & Plan Note (Signed)
flonase as needed.   

## 2017-10-22 NOTE — Assessment & Plan Note (Signed)
tsh

## 2017-10-22 NOTE — Assessment & Plan Note (Signed)
Check lipid panel, tsh, cmp  Not on medication Regular exercise and healthy diet encouraged

## 2017-10-30 ENCOUNTER — Other Ambulatory Visit: Payer: Self-pay | Admitting: Internal Medicine

## 2017-11-01 ENCOUNTER — Other Ambulatory Visit: Payer: Self-pay | Admitting: Internal Medicine

## 2017-11-03 ENCOUNTER — Ambulatory Visit (INDEPENDENT_AMBULATORY_CARE_PROVIDER_SITE_OTHER): Payer: BC Managed Care – PPO | Admitting: Psychology

## 2017-11-03 DIAGNOSIS — F4323 Adjustment disorder with mixed anxiety and depressed mood: Secondary | ICD-10-CM

## 2017-11-17 ENCOUNTER — Encounter: Payer: Self-pay | Admitting: Internal Medicine

## 2017-11-17 ENCOUNTER — Ambulatory Visit (INDEPENDENT_AMBULATORY_CARE_PROVIDER_SITE_OTHER): Payer: BC Managed Care – PPO | Admitting: Psychology

## 2017-11-17 DIAGNOSIS — F4323 Adjustment disorder with mixed anxiety and depressed mood: Secondary | ICD-10-CM

## 2017-11-26 ENCOUNTER — Ambulatory Visit (INDEPENDENT_AMBULATORY_CARE_PROVIDER_SITE_OTHER): Payer: BC Managed Care – PPO | Admitting: Psychology

## 2017-11-26 DIAGNOSIS — F4323 Adjustment disorder with mixed anxiety and depressed mood: Secondary | ICD-10-CM

## 2017-12-01 ENCOUNTER — Ambulatory Visit: Payer: Self-pay | Admitting: Psychology

## 2017-12-06 ENCOUNTER — Ambulatory Visit: Payer: BC Managed Care – PPO | Admitting: Cardiovascular Disease

## 2017-12-14 ENCOUNTER — Ambulatory Visit (INDEPENDENT_AMBULATORY_CARE_PROVIDER_SITE_OTHER): Payer: BC Managed Care – PPO | Admitting: Psychology

## 2017-12-14 DIAGNOSIS — F4323 Adjustment disorder with mixed anxiety and depressed mood: Secondary | ICD-10-CM

## 2017-12-15 ENCOUNTER — Ambulatory Visit: Payer: BC Managed Care – PPO | Admitting: Psychology

## 2017-12-15 ENCOUNTER — Ambulatory Visit: Payer: Self-pay | Admitting: Psychology

## 2017-12-21 ENCOUNTER — Ambulatory Visit (INDEPENDENT_AMBULATORY_CARE_PROVIDER_SITE_OTHER): Payer: BC Managed Care – PPO | Admitting: Psychology

## 2017-12-21 DIAGNOSIS — F4323 Adjustment disorder with mixed anxiety and depressed mood: Secondary | ICD-10-CM | POA: Diagnosis not present

## 2017-12-27 ENCOUNTER — Encounter

## 2017-12-27 ENCOUNTER — Ambulatory Visit: Payer: BC Managed Care – PPO | Admitting: Cardiovascular Disease

## 2017-12-29 ENCOUNTER — Ambulatory Visit (INDEPENDENT_AMBULATORY_CARE_PROVIDER_SITE_OTHER): Payer: BC Managed Care – PPO | Admitting: Psychology

## 2017-12-29 ENCOUNTER — Ambulatory Visit: Payer: BC Managed Care – PPO | Admitting: Psychology

## 2017-12-29 DIAGNOSIS — F4323 Adjustment disorder with mixed anxiety and depressed mood: Secondary | ICD-10-CM | POA: Diagnosis not present

## 2018-01-12 ENCOUNTER — Ambulatory Visit (INDEPENDENT_AMBULATORY_CARE_PROVIDER_SITE_OTHER): Payer: BC Managed Care – PPO | Admitting: Psychology

## 2018-01-12 DIAGNOSIS — F4323 Adjustment disorder with mixed anxiety and depressed mood: Secondary | ICD-10-CM | POA: Diagnosis not present

## 2018-01-21 ENCOUNTER — Ambulatory Visit: Payer: BC Managed Care – PPO | Admitting: Internal Medicine

## 2018-01-26 ENCOUNTER — Ambulatory Visit (INDEPENDENT_AMBULATORY_CARE_PROVIDER_SITE_OTHER): Payer: BC Managed Care – PPO | Admitting: Psychology

## 2018-01-26 ENCOUNTER — Ambulatory Visit: Payer: BC Managed Care – PPO | Admitting: Psychology

## 2018-01-26 DIAGNOSIS — F4323 Adjustment disorder with mixed anxiety and depressed mood: Secondary | ICD-10-CM

## 2018-02-15 ENCOUNTER — Other Ambulatory Visit: Payer: Self-pay | Admitting: Internal Medicine

## 2018-02-23 ENCOUNTER — Ambulatory Visit (INDEPENDENT_AMBULATORY_CARE_PROVIDER_SITE_OTHER): Payer: BC Managed Care – PPO | Admitting: Psychology

## 2018-02-23 DIAGNOSIS — F4323 Adjustment disorder with mixed anxiety and depressed mood: Secondary | ICD-10-CM

## 2018-03-08 ENCOUNTER — Encounter: Payer: Self-pay | Admitting: Internal Medicine

## 2018-03-08 ENCOUNTER — Ambulatory Visit: Payer: BC Managed Care – PPO | Admitting: Internal Medicine

## 2018-03-08 VITALS — BP 100/70 | HR 70 | Ht 67.0 in | Wt 170.2 lb

## 2018-03-08 DIAGNOSIS — E782 Mixed hyperlipidemia: Secondary | ICD-10-CM

## 2018-03-08 DIAGNOSIS — R002 Palpitations: Secondary | ICD-10-CM

## 2018-03-08 DIAGNOSIS — G5603 Carpal tunnel syndrome, bilateral upper limbs: Secondary | ICD-10-CM

## 2018-03-08 DIAGNOSIS — I10 Essential (primary) hypertension: Secondary | ICD-10-CM

## 2018-03-08 DIAGNOSIS — R0602 Shortness of breath: Secondary | ICD-10-CM

## 2018-03-08 NOTE — Patient Instructions (Signed)
Medication Instructions:  Your physician recommends that you continue on your current medications as directed. Please refer to the Current Medication list given to you today.  If you need a refill on your cardiac medications before your next appointment, please call your pharmacy.   Lab work: None ordered If you have labs (blood work) drawn today and your tests are completely normal, you will receive your results only by: Marland Kitchen MyChart Message (if you have MyChart) OR . A paper copy in the mail If you have any lab test that is abnormal or we need to change your treatment, we will call you to review the results.  Testing/Procedures: Your physician has requested that you have an echocardiogram. Echocardiography is a painless test that uses sound waves to create images of your heart. It provides your doctor with information about the size and shape of your heart and how well your heart's chambers and valves are working. This procedure takes approximately one hour. There are no restrictions for this procedure.    Follow-Up: At [facility], you and your health needs are our priority.  As part of our continuing mission to provide you with exceptional heart care, we have created designated Provider Care Teams.  These Care Teams include your primary Cardiologist (physician) and Advanced Practice Providers (APPs -  Physician Assistants and Nurse Practitioners) who all work together to provide you with the care you need, when you need it. You will need a follow up appointment in 3 months.     You may see Dr.Acharya or one of the following Advanced Practice Providers on your designated Care Team:   Theodore Demark, PA-C . Joni Reining, DNP, ANP  Any Other Special Instructions Will Be Listed Below (If Applicable).     Mediterranean Diet A Mediterranean diet refers to food and lifestyle choices that are based on the traditions of countries located on the [employer]. This way of eating has  been shown to help prevent certain conditions and improve outcomes for people who have chronic diseases, like kidney disease and heart disease. What are tips for following this plan? Lifestyle  Cook and eat meals together with your family, when possible.  Drink enough fluid to keep your urine clear or pale yellow.  Be physically active every day. This includes: ? Aerobic exercise like running or swimming. ? Leisure activities like gardening, walking, or housework.  Get 7-8 hours of sleep each night.  If recommended by your health care provider, drink red wine in moderation. This means 1 glass a day for nonpregnant women and 2 glasses a day for men. A glass of wine equals 5 oz (150 mL). Reading food labels   Check the serving size of packaged foods. For foods such as rice and pasta, the serving size refers to the amount of cooked product, not dry.  Check the total fat in packaged foods. Avoid foods that have saturated fat or trans fats.  Check the ingredients list for added sugars, such as corn syrup. Shopping  At the grocery store, buy most of your food from the areas near the walls of the store. This includes: ? Fresh fruits and vegetables (produce). ? Grains, beans, nuts, and seeds. Some of these may be available in unpackaged forms or large amounts (in bulk). ? Fresh seafood. ? Poultry and eggs. ? Low-fat dairy products.  Buy whole ingredients instead of prepackaged foods.  Buy fresh fruits and vegetables in-season from local farmers markets.  Buy frozen fruits and vegetables in resealable  bags.  If you do not have access to quality fresh seafood, buy precooked frozen shrimp or canned fish, such as tuna, salmon, or sardines.  Buy small amounts of raw or cooked vegetables, salads, or olives from the deli or salad bar at your store.  Stock your pantry so you always have certain foods on hand, such as olive oil, canned tuna, canned tomatoes, rice, pasta, and  beans. Cooking  Cook foods with extra-virgin olive oil instead of using butter or other vegetable oils.  Have meat as a side dish, and have vegetables or grains as your main dish. This means having meat in small portions or adding small amounts of meat to foods like pasta or stew.  Use beans or vegetables instead of meat in common dishes like chili or lasagna.  Experiment with different cooking methods. Try roasting or broiling vegetables instead of steaming or sauteing them.  Add frozen vegetables to soups, stews, pasta, or rice.  Add nuts or seeds for added healthy fat at each meal. You can add these to yogurt, salads, or vegetable dishes.  Marinate fish or vegetables using olive oil, lemon juice, garlic, and fresh herbs. Meal planning   Plan to eat 1 vegetarian meal one day each week. Try to work up to 2 vegetarian meals, if possible.  Eat seafood 2 or more times a week.  Have healthy snacks readily available, such as: ? Vegetable sticks with hummus. ? [place] yogurt. ? Fruit and nut trail mix.  Eat balanced meals throughout the week. This includes: ? Fruit: 2-3 servings a day ? Vegetables: 4-5 servings a day ? Low-fat dairy: 2 servings a day ? Fish, poultry, or lean meat: 1 serving a day ? Beans and legumes: 2 or more servings a week ? Nuts and seeds: 1-2 servings a day ? Whole grains: 6-8 servings a day ? Extra-virgin olive oil: 3-4 servings a day  Limit red meat and sweets to only a few servings a month What are my food choices?  Mediterranean diet ? Recommended ? Grains: Whole-grain pasta. Brown rice. Bulgar wheat. Polenta. Couscous. Whole-wheat bread. Orpah Cobb. ? Vegetables: Artichokes. Beets. Broccoli. Cabbage. Carrots. Eggplant. Green beans. Chard. Kale. Spinach. Onions. Leeks. Peas. Squash. Tomatoes. Peppers. Radishes. ? Fruits: Apples. Apricots. Avocado. Berries. Bananas. Cherries. Dates. Figs. Grapes. Lemons. Melon. Oranges. Peaches. Plums.  Pomegranate. ? Meats and other protein foods: Beans. Almonds. Sunflower seeds. Pine nuts. Peanuts. Cod. Salmon. Scallops. Shrimp. Tuna. Tilapia. Clams. Oysters. Eggs. ? Dairy: Low-fat milk. Cheese. Greek yogurt. ? Beverages: Water. Red wine. Herbal tea. ? Fats and oils: Extra virgin olive oil. Avocado oil. Grape seed oil. ? Sweets and desserts: [place] yogurt with honey. Baked apples. Poached pears. Trail mix. ? Seasoning and other foods: Basil. Cilantro. Coriander. Cumin. Mint. Parsley. Sage. Rosemary. Tarragon. Garlic. Oregano. Thyme. Pepper. Balsalmic vinegar. Tahini. Hummus. Tomato sauce. Olives. Mushrooms. ? Limit these ? Grains: Prepackaged pasta or rice dishes. Prepackaged cereal with added sugar. ? Vegetables: Deep fried potatoes (french fries). ? Fruits: Fruit canned in syrup. ? Meats and other protein foods: Beef. Pork. Lamb. Poultry with skin. Hot dogs. Tomasa Blase. ? Dairy: Ice cream. Sour cream. Whole milk. ? Beverages: Juice. Sugar-sweetened soft drinks. Beer. Liquor and spirits. ? Fats and oils: Butter. Canola oil. Vegetable oil. Beef fat (tallow). Lard. ? Sweets and desserts: Cookies. Cakes. Pies. Candy. ? Seasoning and other foods: Mayonnaise. Premade sauces and marinades. ? The items listed may not be a complete list. Talk with your dietitian about what dietary choices  are right for you. Summary  The Mediterranean diet includes both food and lifestyle choices.  Eat a variety of fresh fruits and vegetables, beans, nuts, seeds, and whole grains.  Limit the amount of red meat and sweets that you eat.  Talk with your health care provider about whether it is safe for you to drink red wine in moderation. This means 1 glass a day for nonpregnant women and 2 glasses a day for men. A glass of wine equals 5 oz (150 mL). This information is not intended to replace advice given to you by your health care provider. Make sure you discuss any questions you have with your health care  provider. Document Released: 09/19/2015 Document Revised: 10/22/2015 Document Reviewed: 09/19/2015 Elsevier Interactive Patient Education  2019 [employer]

## 2018-03-08 NOTE — Progress Notes (Signed)
Cardiology Office Note:    Date:  03/08/2018   ID:  Kathleen Weber, DOB 02-24-1960, MRN [id number]  PCP:  Pincus Sanes, MD  Cardiologist:  Nicki Guadalajara, MD  Electrophysiologist:  None   Referring MD: Pincus Sanes, MD   Racing heart beats  History of Present Illness:    Kathleen Weber is a 58 y.o. female with a hx of hypertension, hyperlipidemia, bilateral carpal tunnel, and thyroid nodule who presents today for symptom of racing heart.  She is otherwise in good health and has recently had her annual physical with her primary care doctor Dr. Lawerance Weber.  She is primarily here to establish cardiovascular care, since she followed with a cardiologist 6 years ago when she lived in [place] who functioned as her primary care physician.  She has a family history of cardiovascular disease and followed with cardiology for this indication.  Palpitations: quick heart beats usually associated with large meals and feeling full. Remits with slow deep breaths and relaxation. Lasts for minutes at a time and occurs for several days than can not occur for weeks at a time.  Caffeine: 1.5 tumblers of black coffee daily (this is reduced from previous) Alcohol: infrequent 1-2 x mo Water intake: adequate Snoring: yes, Epworth sleepiness scale score 4 TSH: normal Herbal supplements/diet products: pill from nutritionist to cause early satiety, has not had symptoms while on it Syncope/presyncope: 2 episodes of syncope, one associated with heavy menstrual bleeding and one associated with receiving bad news. No current syncopal episodes or significant presyncope associated with palpitations  She also notes heavy breathing at rest, and is concerned because others will tell her she is breathing heavily and sounds uncomfortable. She does not associate palpitations or exertion with these activities. It is not worsened by deep breathing.  The patient denies chest pain, chest pressure, dyspnea with exertion, PND,  orthopnea, or leg swelling. Denies syncope or presyncope. Denies dizziness or lightheadedness. Denies cough, fever, chills, nausea, vomiting, diarrhea, hematemesis, hematuria, hematochezia, melena.    Past Medical History:  Diagnosis Date  . Arthritis   . Chicken pox   . Gastritis   . Hypertension   . Neuropathy   . Thyroid nodule     Past Surgical History:  Procedure Laterality Date  . neg hx      Current Medications: Current Meds  Medication Sig  . amLODipine (NORVASC) 5 MG tablet TAKE 1 TABLET (5 MG TOTAL) BY MOUTH AT BEDTIME.  Marland Kitchen Biotin 5 MG CAPS Take by mouth.  . Ferrous Sulfate (IRON) 325 (65 FE) MG TABS Take 1 tablet by mouth daily.  . fluticasone (FLONASE) 50 MCG/ACT nasal spray Place 1 spray into both nostrils 2 (two) times daily.  . hydrochlorothiazide (MICROZIDE) 12.5 MG capsule TAKE 1 CAPSULE (12.5 MG TOTAL) BY MOUTH 2 (TWO) TIMES DAILY.  Marland Kitchen losartan (COZAAR) 100 MG tablet TAKE 1 TABLET BY MOUTH EVERY DAY  . Multiple Vitamin (MULTIVITAMIN) tablet Take 1 tablet by mouth daily.  . Probiotic Product (PROBIOTIC DAILY PO) Take by mouth daily.  . vitamin B-12 (CYANOCOBALAMIN) 1000 MCG tablet Take 1,000 mcg by mouth daily.     Allergies:   Pollen extract and Codeine   Social History   Socioeconomic History  . Marital status: Legally Separated    Spouse name: Not on file  . Number of children: 2  . Years of education: Not on file  . Highest education level: Not on file  Occupational History  . Occupation: Magazine features editor:  [employer]  Social Needs  . Financial resource strain: Not on file  . Food insecurity:    Worry: Not on file    Inability: Not on file  . Transportation needs:    Medical: Not on file    Non-medical: Not on file  Tobacco Use  . Smoking status: Never Smoker  . Smokeless tobacco: Never Used  Substance and Sexual Activity  . Alcohol use: No  . Drug use: No  . Sexual activity: Not on file  Lifestyle  . Physical  activity:    Days per week: Not on file    Minutes per session: Not on file  . Stress: Not on file  Relationships  . Social connections:    Talks on phone: Not on file    Gets together: Not on file    Attends religious service: Not on file    Active member of club or organization: Not on file    Attends meetings of clubs or organizations: Not on file    Relationship status: Not on file  Other Topics Concern  . Not on file  Social History Narrative  . Not on file     Family History: The patient's family history includes Coronary artery disease (age of onset: 2672) in her father; Diabetes in her father; Heart disease in her mother; Hypertension in her father and mother; Kidney disease in her mother; Lymphoma in her maternal grandmother; Stomach cancer in her mother; Stroke in her father. There is no history of Colon cancer.  ROS:   Please see the history of present illness.    All other systems reviewed and are negative.  EKGs/Labs/Other Studies Reviewed:    The following studies were reviewed today:  EKG:  NSR, rate 70 bpm, NS T wave abnormality  Recent Labs: 10/22/2017: ALT 13; BUN 13; Creatinine, Ser 0.73; Hemoglobin 14.5; Platelets 200.0; Potassium 3.7; Sodium 138; TSH 1.04  Recent Lipid Panel    Component Value Date/Time   CHOL 180 10/22/2017 0800   TRIG 50.0 10/22/2017 0800   HDL 50.30 10/22/2017 0800   CHOLHDL 4 10/22/2017 0800   VLDL 10.0 10/22/2017 0800   LDLCALC 120 (H) 10/22/2017 0800    Physical Exam:    VS:  BP 100/70   Pulse 70   Ht 5\' 7"  (1.702 m)   Wt 170 lb 3.2 oz (77.2 kg)   BMI 26.66 kg/m     Wt Readings from Last 3 Encounters:  03/08/18 170 lb 3.2 oz (77.2 kg)  10/22/17 174 lb (78.9 kg)  11/09/16 180 lb 6 oz (81.8 kg)     Constitutional: No acute distress Eyes: pupils equally round and reactive to light, sclera non-icteric, normal conjunctiva and lids ENMT: normal dentition, moist mucous membranes Cardiovascular: regular rhythm, normal  rate, no murmurs. S1 and S2 normal. Radial pulses normal bilaterally. No jugular venous distention.  Respiratory: clear to auscultation bilaterally GI : normal bowel sounds, soft and nontender. No distention.   MSK: extremities warm, well perfused. No edema.  NEURO: grossly nonfocal exam, moves all extremities. PSYCH: alert and oriented x 3, normal mood and affect.   ASSESSMENT:    1. Palpitations   2. SOB (shortness of breath)    PLAN:    Kathleen Weber presents today with a family history of cardiovascular disease, intermittent palpitations, and resting shortness of breath that is mild.  With regard to palpitations and shortness of breath, we will evaluate for structural abnormalities and diastolic function with an echocardiogram.  I have offered the patient an extended cardiac monitor for palpitations, however she would like to monitor the symptoms and if she is still feeling unwell in the next few weeks or at her next appointment we will discuss ZIO Patch.  She snores and has an Epworth Sleepiness Scale score of 4 I have offered her sleep consultation for sleep study, and she would like to wait on this until we see each other next.  We will follow-up the results of the echocardiogram.  We have discussed thoroughly preventive measures for best cardiovascular health including Mediterranean diet, exercise recommendations, recommendations for hyperlipidemia and hypertension.  She is on adequate therapy for her blood pressure with losartan 100 mg daily, amlodipine 5 mg daily, and hydrochlorothiazide 12.5 mg twice daily.  She had been followed quite closely for several years for her blood pressure, and now it is under good control.  Follow-up in 3 months.  Medication Adjustments/Labs and Tests Ordered: Current medicines are reviewed at length with the patient today.  Concerns regarding medicines are outlined above.  Orders Placed This Encounter  Procedures  . EKG 12-Lead  . ECHOCARDIOGRAM  COMPLETE   No orders of the defined types were placed in this encounter.   Patient Instructions  Medication Instructions:  Your physician recommends that you continue on your current medications as directed. Please refer to the Current Medication list given to you today.  If you need a refill on your cardiac medications before your next appointment, please call your pharmacy.   Lab work: None ordered If you have labs (blood work) drawn today and your tests are completely normal, you will receive your results only by: Marland Kitchen MyChart Message (if you have MyChart) OR . A paper copy in the mail If you have any lab test that is abnormal or we need to change your treatment, we will call you to review the results.  Testing/Procedures: Your physician has requested that you have an echocardiogram. Echocardiography is a painless test that uses sound waves to create images of your heart. It provides your doctor with information about the size and shape of your heart and how well your heart's chambers and valves are working. This procedure takes approximately one hour. There are no restrictions for this procedure.    Follow-Up: At [facility], you and your health needs are our priority.  As part of our continuing mission to provide you with exceptional heart care, we have created designated Provider Care Teams.  These Care Teams include your primary Cardiologist (physician) and Advanced Practice Providers (APPs -  Physician Assistants and Nurse Practitioners) who all work together to provide you with the care you need, when you need it. You will need a follow up appointment in 3 months.     You may see Dr.Manav Pierotti or one of the following Advanced Practice Providers on your designated Care Team:   Theodore Demark, PA-C . Joni Reining, DNP, ANP  Any Other Special Instructions Will Be Listed Below (If Applicable).     Mediterranean Diet A Mediterranean diet refers to food and lifestyle choices  that are based on the traditions of countries located on the [place]. This way of eating has been shown to help prevent certain conditions and improve outcomes for people who have chronic diseases, like kidney disease and heart disease. What are tips for following this plan? Lifestyle  Cook and eat meals together with your family, when possible.  Drink enough fluid to keep your urine clear or pale yellow.  Be physically active every day. This includes: ? Aerobic exercise like running or swimming. ? Leisure activities like gardening, walking, or housework.  Get 7-8 hours of sleep each night.  If recommended by your health care provider, drink red wine in moderation. This means 1 glass a day for nonpregnant women and 2 glasses a day for men. A glass of wine equals 5 oz (150 mL). Reading food labels   Check the serving size of packaged foods. For foods such as rice and pasta, the serving size refers to the amount of cooked product, not dry.  Check the total fat in packaged foods. Avoid foods that have saturated fat or trans fats.  Check the ingredients list for added sugars, such as corn syrup. Shopping  At the grocery store, buy most of your food from the areas near the walls of the store. This includes: ? Fresh fruits and vegetables (produce). ? Grains, beans, nuts, and seeds. Some of these may be available in unpackaged forms or large amounts (in bulk). ? Fresh seafood. ? Poultry and eggs. ? Low-fat dairy products.  Buy whole ingredients instead of prepackaged foods.  Buy fresh fruits and vegetables in-season from local farmers markets.  Buy frozen fruits and vegetables in resealable bags.  If you do not have access to quality fresh seafood, buy precooked frozen shrimp or canned fish, such as tuna, salmon, or sardines.  Buy small amounts of raw or cooked vegetables, salads, or olives from the deli or salad bar at your store.  Stock your pantry so you always have  certain foods on hand, such as olive oil, canned tuna, canned tomatoes, rice, pasta, and beans. Cooking  Cook foods with extra-virgin olive oil instead of using butter or other vegetable oils.  Have meat as a side dish, and have vegetables or grains as your main dish. This means having meat in small portions or adding small amounts of meat to foods like pasta or stew.  Use beans or vegetables instead of meat in common dishes like chili or lasagna.  Experiment with different cooking methods. Try roasting or broiling vegetables instead of steaming or sauteing them.  Add frozen vegetables to soups, stews, pasta, or rice.  Add nuts or seeds for added healthy fat at each meal. You can add these to yogurt, salads, or vegetable dishes.  Marinate fish or vegetables using olive oil, lemon juice, garlic, and fresh herbs. Meal planning   Plan to eat 1 vegetarian meal one day each week. Try to work up to 2 vegetarian meals, if possible.  Eat seafood 2 or more times a week.  Have healthy snacks readily available, such as: ? Vegetable sticks with hummus. ? [place] yogurt. ? Fruit and nut trail mix.  Eat balanced meals throughout the week. This includes: ? Fruit: 2-3 servings a day ? Vegetables: 4-5 servings a day ? Low-fat dairy: 2 servings a day ? Fish, poultry, or lean meat: 1 serving a day ? Beans and legumes: 2 or more servings a week ? Nuts and seeds: 1-2 servings a day ? Whole grains: 6-8 servings a day ? Extra-virgin olive oil: 3-4 servings a day  Limit red meat and sweets to only a few servings a month What are my food choices?  Mediterranean diet ? Recommended ? Grains: Whole-grain pasta. Brown rice. Bulgar wheat. Polenta. Couscous. Whole-wheat bread. Orpah Cobb. ? Vegetables: Artichokes. Beets. Broccoli. Cabbage. Carrots. Eggplant. Green beans. Chard. Kale. Spinach. Onions. Leeks. Peas. Squash. Tomatoes. Peppers. Radishes. ? Fruits: Apples.  Apricots. Avocado. Berries.  Bananas. Cherries. Dates. Figs. Grapes. Lemons. Melon. Oranges. Peaches. Plums. Pomegranate. ? Meats and other protein foods: Beans. Almonds. Sunflower seeds. Pine nuts. Peanuts. Cod. Salmon. Scallops. Shrimp. Tuna. Tilapia. Clams. Oysters. Eggs. ? Dairy: Low-fat milk. Cheese. Greek yogurt. ? Beverages: Water. Red wine. Herbal tea. ? Fats and oils: Extra virgin olive oil. Avocado oil. Grape seed oil. ? Sweets and desserts: [place] yogurt with honey. Baked apples. Poached pears. Trail mix. ? Seasoning and other foods: Basil. Cilantro. Coriander. Cumin. Mint. Parsley. Sage. Rosemary. Tarragon. Garlic. Oregano. Thyme. Pepper. Balsalmic vinegar. Tahini. Hummus. Tomato sauce. Olives. Mushrooms. ? Limit these ? Grains: Prepackaged pasta or rice dishes. Prepackaged cereal with added sugar. ? Vegetables: Deep fried potatoes (french fries). ? Fruits: Fruit canned in syrup. ? Meats and other protein foods: Beef. Pork. Lamb. Poultry with skin. Hot dogs. Tomasa BlaseBacon. ? Dairy: Ice cream. Sour cream. Whole milk. ? Beverages: Juice. Sugar-sweetened soft drinks. Beer. Liquor and spirits. ? Fats and oils: Butter. Canola oil. Vegetable oil. Beef fat (tallow). Lard. ? Sweets and desserts: Cookies. Cakes. Pies. Candy. ? Seasoning and other foods: Mayonnaise. Premade sauces and marinades. ? The items listed may not be a complete list. Talk with your dietitian about what dietary choices are right for you. Summary  The Mediterranean diet includes both food and lifestyle choices.  Eat a variety of fresh fruits and vegetables, beans, nuts, seeds, and whole grains.  Limit the amount of red meat and sweets that you eat.  Talk with your health care provider about whether it is safe for you to drink red wine in moderation. This means 1 glass a day for nonpregnant women and 2 glasses a day for men. A glass of wine equals 5 oz (150 mL). This information is not intended to replace advice given to you by your health care  provider. Make sure you discuss any questions you have with your health care provider. Document Released: 09/19/2015 Document Revised: 10/22/2015 Document Reviewed: 09/19/2015 Elsevier Interactive Patient Education  2019 [employer]       Signed, Parke PoissonGayatri A Shine Mikes, MD  03/08/2018 11:41 AM    [facility]

## 2018-03-09 ENCOUNTER — Ambulatory Visit (INDEPENDENT_AMBULATORY_CARE_PROVIDER_SITE_OTHER): Payer: BC Managed Care – PPO | Admitting: Psychology

## 2018-03-09 DIAGNOSIS — F4323 Adjustment disorder with mixed anxiety and depressed mood: Secondary | ICD-10-CM

## 2018-03-11 ENCOUNTER — Ambulatory Visit (HOSPITAL_COMMUNITY): Payer: BC Managed Care – PPO | Attending: Internal Medicine

## 2018-03-11 DIAGNOSIS — R002 Palpitations: Secondary | ICD-10-CM | POA: Insufficient documentation

## 2018-03-11 DIAGNOSIS — R0602 Shortness of breath: Secondary | ICD-10-CM | POA: Diagnosis not present

## 2018-03-16 ENCOUNTER — Encounter: Payer: Self-pay | Admitting: Internal Medicine

## 2018-03-16 DIAGNOSIS — H9190 Unspecified hearing loss, unspecified ear: Secondary | ICD-10-CM

## 2018-03-23 ENCOUNTER — Ambulatory Visit: Payer: BC Managed Care – PPO | Admitting: Psychology

## 2018-03-24 ENCOUNTER — Ambulatory Visit (INDEPENDENT_AMBULATORY_CARE_PROVIDER_SITE_OTHER): Payer: BC Managed Care – PPO | Admitting: Psychology

## 2018-03-24 DIAGNOSIS — F4323 Adjustment disorder with mixed anxiety and depressed mood: Secondary | ICD-10-CM | POA: Diagnosis not present

## 2018-04-06 ENCOUNTER — Ambulatory Visit: Payer: BC Managed Care – PPO | Admitting: Psychology

## 2018-04-07 ENCOUNTER — Ambulatory Visit (INDEPENDENT_AMBULATORY_CARE_PROVIDER_SITE_OTHER): Payer: BC Managed Care – PPO | Admitting: Psychology

## 2018-04-07 DIAGNOSIS — F4323 Adjustment disorder with mixed anxiety and depressed mood: Secondary | ICD-10-CM | POA: Diagnosis not present

## 2018-04-20 ENCOUNTER — Ambulatory Visit (INDEPENDENT_AMBULATORY_CARE_PROVIDER_SITE_OTHER): Payer: BC Managed Care – PPO | Admitting: Psychology

## 2018-04-20 DIAGNOSIS — F4323 Adjustment disorder with mixed anxiety and depressed mood: Secondary | ICD-10-CM

## 2018-04-30 ENCOUNTER — Other Ambulatory Visit: Payer: Self-pay | Admitting: Internal Medicine

## 2018-04-30 ENCOUNTER — Encounter (INDEPENDENT_AMBULATORY_CARE_PROVIDER_SITE_OTHER): Payer: BC Managed Care – PPO | Admitting: Internal Medicine

## 2018-04-30 DIAGNOSIS — R220 Localized swelling, mass and lump, head: Secondary | ICD-10-CM | POA: Diagnosis not present

## 2018-04-30 DIAGNOSIS — J309 Allergic rhinitis, unspecified: Secondary | ICD-10-CM

## 2018-04-30 MED ORDER — PROMETHAZINE-DM 6.25-15 MG/5ML PO SYRP: 5.0000 mL | ORAL_SOLUTION | Freq: Four times a day (QID) | ORAL | 0 refills | Status: DC | PRN

## 2018-05-04 ENCOUNTER — Ambulatory Visit (INDEPENDENT_AMBULATORY_CARE_PROVIDER_SITE_OTHER): Payer: BC Managed Care – PPO | Admitting: Psychology

## 2018-05-04 DIAGNOSIS — F4323 Adjustment disorder with mixed anxiety and depressed mood: Secondary | ICD-10-CM | POA: Diagnosis not present

## 2018-05-04 MED ORDER — PROMETHAZINE-DM 6.25-15 MG/5ML PO SYRP: 5.0000 mL | ORAL_SOLUTION | Freq: Four times a day (QID) | ORAL | 0 refills | Status: DC | PRN

## 2018-05-04 NOTE — Addendum Note (Signed)
Addended by: Pincus Sanes on: 05/04/2018 09:27 PM   Modules accepted: Orders

## 2018-05-05 ENCOUNTER — Other Ambulatory Visit: Payer: Self-pay | Admitting: Internal Medicine

## 2018-05-09 NOTE — Telephone Encounter (Signed)
Cumulative time during 7-day interval 6 minutes, there was not an associated office visit for this concern within a 7 day period.  Patient did provide consent for services prior to services given. Names of all persons present for services: Pincus Sanes, MD, patient, Ardith Dark   Chief complaint: Growth on forehead  She is a growth in the center of her forehead.  2 weeks ago she was seen a dermatologist and showed it to her.  She told Tamir it was not a cyst, but did not provide any further information.  She was concerned because she is wondering if she should see someone for this.  She denies any trauma to the area.   History, background, results pertinent:  Past Medical History:  Diagnosis Date  . Arthritis   . Chicken pox   . Gastritis   . Hypertension   . Neuropathy   . Thyroid nodule     I reviewed photographs that she sent via my chart.   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  Advised this is benign.  Advised this could get larger.  Discussed that we can do an x-ray at some point in the near future once the coronavirus situation has resolved to confirm that it is truly bone.  I discussed with her that that would also be hard and non-mobile.  Advised her to let me know if she has other questions or concerns.

## 2018-05-10 ENCOUNTER — Ambulatory Visit (INDEPENDENT_AMBULATORY_CARE_PROVIDER_SITE_OTHER): Payer: BC Managed Care – PPO | Admitting: Psychology

## 2018-05-10 DIAGNOSIS — F4323 Adjustment disorder with mixed anxiety and depressed mood: Secondary | ICD-10-CM | POA: Diagnosis not present

## 2018-05-18 ENCOUNTER — Telehealth: Payer: Self-pay

## 2018-05-18 ENCOUNTER — Ambulatory Visit (INDEPENDENT_AMBULATORY_CARE_PROVIDER_SITE_OTHER): Payer: BC Managed Care – PPO | Admitting: Psychology

## 2018-05-18 DIAGNOSIS — F4323 Adjustment disorder with mixed anxiety and depressed mood: Secondary | ICD-10-CM

## 2018-05-18 NOTE — Telephone Encounter (Signed)
Virtual Visit Pre-Appointment Phone Call  Steps For Call:  1. Confirm consent - "In the setting of the current Covid19 crisis, you are scheduled for a (phone or video) visit with your provider on (06/03/18) at (8:20AM).  Just as we do with many in-office visits, in order for you to participate in this visit, we must obtain consent.  If you'd like, I can send this to your mychart (if signed up) or email for you to review.  Otherwise, I can obtain your verbal consent now.  All virtual visits are billed to your insurance company just like a normal visit would be.  By agreeing to a virtual visit, we'd like you to understand that the technology does not allow for your provider to perform an examination, and thus may limit your provider's ability to fully assess your condition.  Finally, though the technology is pretty good, we cannot assure that it will always work on either your or our end, and in the setting of a video visit, we may have to convert it to a phone-only visit.  In either situation, we cannot ensure that we have a secure connection.  Are you willing to proceed?"    TELEPHONE CALL NOTE  Kathleen Weber has been deemed a candidate for a follow-up tele-health visit to limit community exposure during the Covid-19 pandemic. I spoke with the patient via phone to ensure availability of phone/video source, confirm preferred email & phone number, and discuss instructions and expectations.  I reminded Kathleen Weber to be prepared with any vital sign and/or heart rhythm information that could potentially be obtained via home monitoring, at the time of her visit. I reminded Kathleen Weber to expect a phone call at the time of her visit if her visit.  Did the patient verbally acknowledge consent to treatment? YES  Adline Pealsiwan N Latarra Eagleton, CMA 05/18/2018 4:20 PM   DOWNLOADING THE WEBEX SOFTWARE TO SMARTPHONE  - If Apple, go to [employer] Store and type in WebEx in the search bar. Download [employer] Webex  Meetings, the blue/green circle. The app is free but as with any other app downloads, their phone may require them to verify saved payment information or Apple password. The patient does NOT have to create an account.  - If Android, ask patient to go to [employer] Play Store and type in WebEx in the search bar. Download [employer] Meetings, the blue/green circle. The app is free but as with any other app downloads, their phone may require them to verify saved payment information or Android password. The patient does NOT have to create an account.   CONSENT FOR TELE-HEALTH VISIT - PLEASE REVIEW  I hereby voluntarily request, consent and authorize [facility] and its employed or contracted physicians, physician assistants, nurse practitioners or other licensed health care professionals (the Practitioner), to provide me with telemedicine health care services (the Services") as deemed necessary by the treating Practitioner. I acknowledge and consent to receive the Services by the Practitioner via telemedicine. I understand that the telemedicine visit will involve communicating with the Practitioner through live audiovisual communication technology and the disclosure of certain medical information by electronic transmission. I acknowledge that I have been given the opportunity to request an in-person assessment or other available alternative prior to the telemedicine visit and am voluntarily participating in the telemedicine visit.  I understand that I have the right to withhold or withdraw my consent to the use of telemedicine in the course of my care at any time, without  affecting my right to future care or treatment, and that the Practitioner or I may terminate the telemedicine visit at any time. I understand that I have the right to inspect all information obtained and/or recorded in the course of the telemedicine visit and may receive copies of available information for a reasonable fee.  I understand that  some of the potential risks of receiving the Services via telemedicine include:   Delay or interruption in medical evaluation due to technological equipment failure or disruption;  Information transmitted may not be sufficient (e.g. poor resolution of images) to allow for appropriate medical decision making by the Practitioner; and/or   In rare instances, security protocols could fail, causing a breach of personal health information.  Furthermore, I acknowledge that it is my responsibility to provide information about my medical history, conditions and care that is complete and accurate to the best of my ability. I acknowledge that Practitioner's advice, recommendations, and/or decision may be based on factors not within their control, such as incomplete or inaccurate data provided by me or distortions of diagnostic images or specimens that may result from electronic transmissions. I understand that the practice of medicine is not an exact science and that Practitioner makes no warranties or guarantees regarding treatment outcomes. I acknowledge that I will receive a copy of this consent concurrently upon execution via email to the email address I last provided but may also request a printed copy by calling the office of [facility].    I understand that my insurance will be billed for this visit.   I have read or had this consent read to me.  I understand the contents of this consent, which adequately explains the benefits and risks of the Services being provided via telemedicine.   I have been provided ample opportunity to ask questions regarding this consent and the Services and have had my questions answered to my satisfaction.  I give my informed consent for the services to be provided through the use of telemedicine in my medical care  By participating in this telemedicine visit I agree to the above.

## 2018-05-25 ENCOUNTER — Ambulatory Visit (INDEPENDENT_AMBULATORY_CARE_PROVIDER_SITE_OTHER): Payer: BC Managed Care – PPO | Admitting: Psychology

## 2018-05-25 DIAGNOSIS — F4323 Adjustment disorder with mixed anxiety and depressed mood: Secondary | ICD-10-CM | POA: Diagnosis not present

## 2018-06-01 ENCOUNTER — Ambulatory Visit (INDEPENDENT_AMBULATORY_CARE_PROVIDER_SITE_OTHER): Payer: BC Managed Care – PPO | Admitting: Psychology

## 2018-06-01 DIAGNOSIS — F4323 Adjustment disorder with mixed anxiety and depressed mood: Secondary | ICD-10-CM

## 2018-06-03 ENCOUNTER — Ambulatory Visit: Payer: Self-pay | Admitting: Internal Medicine

## 2018-06-03 ENCOUNTER — Telehealth: Payer: BC Managed Care – PPO | Admitting: Internal Medicine

## 2018-06-08 ENCOUNTER — Ambulatory Visit (INDEPENDENT_AMBULATORY_CARE_PROVIDER_SITE_OTHER): Payer: BC Managed Care – PPO | Admitting: Psychology

## 2018-06-08 DIAGNOSIS — F4323 Adjustment disorder with mixed anxiety and depressed mood: Secondary | ICD-10-CM

## 2018-06-12 ENCOUNTER — Other Ambulatory Visit: Payer: Self-pay | Admitting: Internal Medicine

## 2018-06-12 DIAGNOSIS — J309 Allergic rhinitis, unspecified: Secondary | ICD-10-CM

## 2018-06-15 ENCOUNTER — Ambulatory Visit (INDEPENDENT_AMBULATORY_CARE_PROVIDER_SITE_OTHER): Payer: BC Managed Care – PPO | Admitting: Psychology

## 2018-06-15 DIAGNOSIS — F4323 Adjustment disorder with mixed anxiety and depressed mood: Secondary | ICD-10-CM

## 2018-06-23 ENCOUNTER — Ambulatory Visit (INDEPENDENT_AMBULATORY_CARE_PROVIDER_SITE_OTHER): Payer: BC Managed Care – PPO | Admitting: Psychology

## 2018-06-23 DIAGNOSIS — F4323 Adjustment disorder with mixed anxiety and depressed mood: Secondary | ICD-10-CM

## 2018-06-29 ENCOUNTER — Ambulatory Visit (INDEPENDENT_AMBULATORY_CARE_PROVIDER_SITE_OTHER): Payer: BC Managed Care – PPO | Admitting: Psychology

## 2018-06-29 DIAGNOSIS — F4323 Adjustment disorder with mixed anxiety and depressed mood: Secondary | ICD-10-CM | POA: Diagnosis not present

## 2018-07-06 ENCOUNTER — Ambulatory Visit: Payer: BC Managed Care – PPO | Admitting: Psychology

## 2018-07-13 ENCOUNTER — Ambulatory Visit (INDEPENDENT_AMBULATORY_CARE_PROVIDER_SITE_OTHER): Payer: BC Managed Care – PPO | Admitting: Psychology

## 2018-07-13 DIAGNOSIS — F4323 Adjustment disorder with mixed anxiety and depressed mood: Secondary | ICD-10-CM

## 2018-07-27 ENCOUNTER — Ambulatory Visit (INDEPENDENT_AMBULATORY_CARE_PROVIDER_SITE_OTHER): Payer: BC Managed Care – PPO | Admitting: Psychology

## 2018-07-27 DIAGNOSIS — F4323 Adjustment disorder with mixed anxiety and depressed mood: Secondary | ICD-10-CM | POA: Diagnosis not present

## 2018-07-31 ENCOUNTER — Other Ambulatory Visit: Payer: Self-pay | Admitting: Internal Medicine

## 2018-08-01 ENCOUNTER — Encounter: Payer: Self-pay | Admitting: Internal Medicine

## 2018-08-01 MED ORDER — OMEPRAZOLE 40 MG PO CPDR: 40.0000 mg | DELAYED_RELEASE_CAPSULE | Freq: Every day | ORAL | 1 refills | Status: DC

## 2018-08-01 NOTE — Telephone Encounter (Signed)
Prescription sent in.  Should avoid using long term.

## 2018-08-02 ENCOUNTER — Other Ambulatory Visit: Payer: Self-pay | Admitting: Internal Medicine

## 2018-08-10 ENCOUNTER — Ambulatory Visit (INDEPENDENT_AMBULATORY_CARE_PROVIDER_SITE_OTHER): Payer: BC Managed Care – PPO | Admitting: Psychology

## 2018-08-10 DIAGNOSIS — F4323 Adjustment disorder with mixed anxiety and depressed mood: Secondary | ICD-10-CM | POA: Diagnosis not present

## 2018-08-18 ENCOUNTER — Ambulatory Visit (INDEPENDENT_AMBULATORY_CARE_PROVIDER_SITE_OTHER): Payer: BC Managed Care – PPO | Admitting: Psychology

## 2018-08-18 DIAGNOSIS — F4323 Adjustment disorder with mixed anxiety and depressed mood: Secondary | ICD-10-CM | POA: Diagnosis not present

## 2018-08-24 ENCOUNTER — Ambulatory Visit (INDEPENDENT_AMBULATORY_CARE_PROVIDER_SITE_OTHER): Payer: BC Managed Care – PPO | Admitting: Psychology

## 2018-08-24 DIAGNOSIS — F4323 Adjustment disorder with mixed anxiety and depressed mood: Secondary | ICD-10-CM | POA: Diagnosis not present

## 2018-09-05 ENCOUNTER — Ambulatory Visit: Payer: BC Managed Care – PPO | Admitting: Psychology

## 2018-09-07 ENCOUNTER — Ambulatory Visit: Payer: BC Managed Care – PPO | Admitting: Psychology

## 2018-09-08 ENCOUNTER — Ambulatory Visit (INDEPENDENT_AMBULATORY_CARE_PROVIDER_SITE_OTHER): Payer: BC Managed Care – PPO | Admitting: Psychology

## 2018-09-08 DIAGNOSIS — F4323 Adjustment disorder with mixed anxiety and depressed mood: Secondary | ICD-10-CM

## 2018-09-13 ENCOUNTER — Telehealth: Payer: BC Managed Care – PPO | Admitting: Family

## 2018-09-13 DIAGNOSIS — R059 Cough, unspecified: Secondary | ICD-10-CM

## 2018-09-13 DIAGNOSIS — R05 Cough: Secondary | ICD-10-CM

## 2018-09-13 NOTE — Progress Notes (Signed)
The only way to get that particular cough medication is in-person because it is a narcotic. We cannot prescribe narcotics through e-visit. Contact your primary care provider  Based on what you shared with me, I feel your condition warrants further evaluation and I recommend that you be seen for a face to face office visit.  NOTE: If you entered your credit card information for this eVisit, you will not be charged. You may see a "hold" on your card for the $35 but that hold will drop off and you will not have a charge processed.  If you are having a true medical emergency please call 911.     For an urgent face to face visit, [place] has five urgent care centers for your convenience:    [email or web address] to reserve your spot online an avoid wait times  [facility] (New Address!) [address] [place], [place] [address] *Just off [address], across the road from [place] hours of operation: Monday-Friday, 12 PM to 6 PM  Closed Saturday & Sunday   The following sites will take your insurance:  . [facility]    [phone]                  Get Driving Directions  [address], [place] [address] . 10 am to 8 pm Monday-Friday . 12 pm to 8 pm Saturday-Sunday   . [facility] at [place]                  Get Driving Directions  [address], [place], [place] [address] . 8 am to 8 pm Monday-Friday . 9 am to 6 pm Saturday . 11 am to 6 pm Sunday   . [facility] at [place]                  Get Driving Directions   [address] [place], [place] [address] . 8 am to 8 pm Monday-Friday . 8 am to 4 pm Saturday-Sunday    . [facility] at [place]                    Get Driving Directions  [phone]  [address], [place],  [address]  . Monday-Friday,  12 PM to 6 PM    Your e-visit answers were reviewed by a board certified advanced clinical practitioner to complete your personal care plan.  Thank you for using e-Visits.

## 2018-09-21 ENCOUNTER — Ambulatory Visit: Payer: BC Managed Care – PPO | Admitting: Psychology

## 2018-09-22 ENCOUNTER — Ambulatory Visit (INDEPENDENT_AMBULATORY_CARE_PROVIDER_SITE_OTHER): Payer: BC Managed Care – PPO | Admitting: Psychology

## 2018-09-22 DIAGNOSIS — F4323 Adjustment disorder with mixed anxiety and depressed mood: Secondary | ICD-10-CM

## 2018-10-01 ENCOUNTER — Other Ambulatory Visit: Payer: Self-pay | Admitting: Internal Medicine

## 2018-10-01 DIAGNOSIS — J309 Allergic rhinitis, unspecified: Secondary | ICD-10-CM

## 2018-10-04 DIAGNOSIS — K219 Gastro-esophageal reflux disease without esophagitis: Secondary | ICD-10-CM | POA: Insufficient documentation

## 2018-10-05 ENCOUNTER — Ambulatory Visit (INDEPENDENT_AMBULATORY_CARE_PROVIDER_SITE_OTHER): Payer: BC Managed Care – PPO | Admitting: Psychology

## 2018-10-05 DIAGNOSIS — F4323 Adjustment disorder with mixed anxiety and depressed mood: Secondary | ICD-10-CM | POA: Diagnosis not present

## 2018-10-16 ENCOUNTER — Other Ambulatory Visit: Payer: Self-pay | Admitting: Internal Medicine

## 2018-10-16 DIAGNOSIS — J309 Allergic rhinitis, unspecified: Secondary | ICD-10-CM

## 2018-10-19 ENCOUNTER — Ambulatory Visit (INDEPENDENT_AMBULATORY_CARE_PROVIDER_SITE_OTHER): Payer: BC Managed Care – PPO | Admitting: Psychology

## 2018-10-19 DIAGNOSIS — F4323 Adjustment disorder with mixed anxiety and depressed mood: Secondary | ICD-10-CM

## 2018-10-23 ENCOUNTER — Other Ambulatory Visit: Payer: Self-pay | Admitting: Internal Medicine

## 2018-10-27 ENCOUNTER — Other Ambulatory Visit: Payer: Self-pay | Admitting: Internal Medicine

## 2018-11-02 ENCOUNTER — Ambulatory Visit: Payer: BC Managed Care – PPO | Admitting: Psychology

## 2018-11-07 ENCOUNTER — Telehealth: Payer: Self-pay | Admitting: Emergency Medicine

## 2018-11-07 DIAGNOSIS — E782 Mixed hyperlipidemia: Secondary | ICD-10-CM

## 2018-11-07 DIAGNOSIS — R739 Hyperglycemia, unspecified: Secondary | ICD-10-CM

## 2018-11-07 DIAGNOSIS — I10 Essential (primary) hypertension: Secondary | ICD-10-CM

## 2018-11-07 DIAGNOSIS — Z833 Family history of diabetes mellitus: Secondary | ICD-10-CM

## 2018-11-07 DIAGNOSIS — E041 Nontoxic single thyroid nodule: Secondary | ICD-10-CM

## 2018-11-07 NOTE — Patient Instructions (Addendum)
All other Health Maintenance issues reviewed.   All recommended immunizations and age-appropriate screenings are up-to-date or discussed.  Tetanus immunization administered today.   Medications reviewed and updated.  Changes include :   none    Please followup in 1 year    Health Maintenance, Female Adopting a healthy lifestyle and getting preventive care are important in promoting health and wellness. Ask your health care provider about:  The right schedule for you to have regular tests and exams.  Things you can do on your own to prevent diseases and keep yourself healthy. What should I know about diet, weight, and exercise? Eat a healthy diet   Eat a diet that includes plenty of vegetables, fruits, low-fat dairy products, and lean protein.  Do not eat a lot of foods that are high in solid fats, added sugars, or sodium. Maintain a healthy weight Body mass index (BMI) is used to identify weight problems. It estimates body fat based on height and weight. Your health care provider can help determine your BMI and help you achieve or maintain a healthy weight. Get regular exercise Get regular exercise. This is one of the most important things you can do for your health. Most adults should:  Exercise for at least 150 minutes each week. The exercise should increase your heart rate and make you sweat (moderate-intensity exercise).  Do strengthening exercises at least twice a week. This is in addition to the moderate-intensity exercise.  Spend less time sitting. Even light physical activity can be beneficial. Watch cholesterol and blood lipids Have your blood tested for lipids and cholesterol at 59 years of age, then have this test every 5 years. Have your cholesterol levels checked more often if:  Your lipid or cholesterol levels are high.  You are older than 60 years of age.  You are at high risk for heart disease. What should I know about cancer screening? Depending on your  health history and family history, you may need to have cancer screening at various ages. This may include screening for:  Breast cancer.  Cervical cancer.  Colorectal cancer.  Skin cancer.  Lung cancer. What should I know about heart disease, diabetes, and high blood pressure? Blood pressure and heart disease  High blood pressure causes heart disease and increases the risk of stroke. This is more likely to develop in people who have high blood pressure readings, are of African descent, or are overweight.  Have your blood pressure checked: ? Every 3-5 years if you are 32-41 years of age. ? Every year if you are 42 years old or older. Diabetes Have regular diabetes screenings. This checks your fasting blood sugar level. Have the screening done:  Once every three years after age 82 if you are at a normal weight and have a low risk for diabetes.  More often and at a younger age if you are overweight or have a high risk for diabetes. What should I know about preventing infection? Hepatitis B If you have a higher risk for hepatitis B, you should be screened for this virus. Talk with your health care provider to find out if you are at risk for hepatitis B infection. Hepatitis C Testing is recommended for:  Everyone born from [birth year] through 1966.  Anyone with known risk factors for hepatitis C. Sexually transmitted infections (STIs)  Get screened for STIs, including gonorrhea and chlamydia, if: ? You are sexually active and are younger than 59 years of age. ? You are older than 59  years of age and your health care provider tells you that you are at risk for this type of infection. ? Your sexual activity has changed since you were last screened, and you are at increased risk for chlamydia or gonorrhea. Ask your health care provider if you are at risk.  Ask your health care provider about whether you are at high risk for HIV. Your health care provider may recommend a prescription  medicine to help prevent HIV infection. If you choose to take medicine to prevent HIV, you should first get tested for HIV. You should then be tested every 3 months for as long as you are taking the medicine. Pregnancy  If you are about to stop having your period (premenopausal) and you may become pregnant, seek counseling before you get pregnant.  Take 400 to 800 micrograms (mcg) of folic acid every day if you become pregnant.  Ask for birth control (contraception) if you want to prevent pregnancy. Osteoporosis and menopause Osteoporosis is a disease in which the bones lose minerals and strength with aging. This can result in bone fractures. If you are 26 years old or older, or if you are at risk for osteoporosis and fractures, ask your health care provider if you should:  Be screened for bone loss.  Take a calcium or vitamin D supplement to lower your risk of fractures.  Be given hormone replacement therapy (HRT) to treat symptoms of menopause. Follow these instructions at home: Lifestyle  Do not use any products that contain nicotine or tobacco, such as cigarettes, e-cigarettes, and chewing tobacco. If you need help quitting, ask your health care provider.  Do not use street drugs.  Do not share needles.  Ask your health care provider for help if you need support or information about quitting drugs. Alcohol use  Do not drink alcohol if: ? Your health care provider tells you not to drink. ? You are pregnant, may be pregnant, or are planning to become pregnant.  If you drink alcohol: ? Limit how much you use to 0-1 drink a day. ? Limit intake if you are breastfeeding.  Be aware of how much alcohol is in your drink. In the [place] one drink equals one 12 oz bottle of beer (355 mL), one 5 oz glass of wine (148 mL), or one 1 oz glass of hard liquor (44 mL). General instructions  Schedule regular health, dental, and eye exams.  Stay current with your vaccines.  Tell your health  care provider if: ? You often feel depressed. ? You have ever been abused or do not feel safe at home. Summary  Adopting a healthy lifestyle and getting preventive care are important in promoting health and wellness.  Follow your health care provider's instructions about healthy diet, exercising, and getting tested or screened for diseases.  Follow your health care provider's instructions on monitoring your cholesterol and blood pressure. This information is not intended to replace advice given to you by your health care provider. Make sure you discuss any questions you have with your health care provider. Document Released: 08/11/2010 Document Revised: 01/19/2018 Document Reviewed: 01/19/2018 Elsevier Patient Education  2020 [employer].

## 2018-11-07 NOTE — Progress Notes (Signed)
Subjective:    Patient ID: Kathleen Weber, female    DOB: 1961/01/11, 58 y.o.   MRN: [id number]  HPI She is here for a physical exam.   Chronic cough:  She has it forever, since she was in her 31's.  It was worse in the spring.   She saw ENT and was told it was GERD.  She started on omeprazole - taken for the last two weeks and she is unsure if it helped.  She is taking allegra.  The cough is worse in the spring and fall.    Medications and allergies reviewed with patient and updated if appropriate.  Patient Active Problem List   Diagnosis Date Noted  . Racing heart beat 10/22/2017  . Family history of diabetes mellitus 10/19/2017  . Hyperglycemia 10/01/2016  . Carpal tunnel syndrome, bilateral 02/22/2016  . IBS (irritable bowel syndrome) 08/05/2015  . Colitis 05/15/2015  . Hyperlipidemia 05/15/2015  . Hypertension 12/19/2012  . Allergic rhinitis 12/19/2012  . Thyroid nodule   . Bilateral hand numbness   . Overweight     Current Outpatient Medications on File Prior to Visit  Medication Sig Dispense Refill  . amLODipine (NORVASC) 5 MG tablet Take 1 tablet (5 mg total) by mouth at bedtime. **OFFICE VISIT DUE** 90 tablet 0  . Biotin 5 MG CAPS Take by mouth.    Marland Kitchen BROMFED DM 30-2-10 MG/5ML syrup Please specify directions, refills and quantity 120 mL 0  . Ferrous Sulfate (IRON) 325 (65 FE) MG TABS Take 1 tablet by mouth daily.    . fluticasone (FLONASE) 50 MCG/ACT nasal spray PLACE 1 SPRAY INTO BOTH NOSTRILS DAILY. 48 mL 5  . hydrochlorothiazide (MICROZIDE) 12.5 MG capsule TAKE 1 CAPSULE TWICE A DAY. NEED OFFICE VISIT 60 capsule 0  . losartan (COZAAR) 100 MG tablet TAKE 1 TABLET BY MOUTH EVERY DAY 87 tablet 1  . Multiple Vitamin (MULTIVITAMIN) tablet Take 1 tablet by mouth daily.    Marland Kitchen omeprazole (PRILOSEC) 40 MG capsule Take 1 capsule (40 mg total) by mouth daily. 90 capsule 1  . Probiotic Product (PROBIOTIC DAILY PO) Take by mouth daily.    . vitamin B-12 (CYANOCOBALAMIN)  1000 MCG tablet Take 1,000 mcg by mouth daily.     No current facility-administered medications on file prior to visit.     Past Medical History:  Diagnosis Date  . Arthritis   . Chicken pox   . Gastritis   . Hypertension   . Neuropathy   . Thyroid nodule     Past Surgical History:  Procedure Laterality Date  . neg hx      Social History   Socioeconomic History  . Marital status: Married    Spouse name: Not on file  . Number of children: 2  . Years of education: Not on file  . Highest education level: Not on file  Occupational History  . 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 Financial resource strain: Not on file  . Food insecurity    Worry: Not on file    Inability: Not on file  . Transportation needs    Medical: Not on file    Non-medical: Not on file  Tobacco Use  . Smoking status: Never Smoker  . Smokeless tobacco: Never Used  Substance and Sexual Activity  . Alcohol use: No  . Drug use: No  . Sexual activity: Not on file  Lifestyle  . Physical activity  Days per week: Not on file    Minutes per session: Not on file  . Stress: Not on file  Relationships  . Social Musicianconnections    Talks on phone: Not on file    Gets together: Not on file    Attends religious service: Not on file    Active member of club or organization: Not on file    Attends meetings of clubs or organizations: Not on file    Relationship status: Not on file  Other Topics Concern  . Not on file  Social History Narrative  . Not on file    Family History  Problem Relation Age of Onset  . Stomach cancer Mother   . Heart disease Mother   . Kidney disease Mother        kidney cancer  . Hypertension Mother   . Diabetes Father   . Coronary artery disease Father [id number]       CABGx5  . Stroke Father        TIAs  . Hypertension Father   . Lymphoma Maternal Grandmother   . Colon cancer Neg Hx     Review of Systems  Constitutional:  Negative for chills and fever.  Eyes: Negative for visual disturbance.  Respiratory: Positive for cough. Negative for shortness of breath and wheezing.   Cardiovascular: Positive for palpitations. Negative for chest pain and leg swelling.  Gastrointestinal: Negative for abdominal pain, blood in stool, constipation, diarrhea and nausea.       GERD  Genitourinary: Negative for dysuria and hematuria.  Musculoskeletal: Negative for arthralgias and back pain.  Skin: Negative for color change and rash.  Neurological: Negative for light-headedness and headaches.  Psychiatric/Behavioral: Positive for sleep disturbance. Negative for dysphoric mood. The patient is not nervous/anxious.        Memory difficulties       Objective:   Vitals:   11/08/18 1434  BP: 122/70  Pulse: 70  Resp: 16  Temp: 98.5 F (36.9 C)  SpO2: 95%   Filed Weights   11/08/18 1434  Weight: 178 lb (80.7 kg)   Body mass index is 27.88 kg/m.  BP Readings from Last 3 Encounters:  11/08/18 122/70  03/08/18 100/70  10/22/17 118/70    Wt Readings from Last 3 Encounters:  11/08/18 178 lb (80.7 kg)  03/08/18 170 lb 3.2 oz (77.2 kg)  10/22/17 174 lb (78.9 kg)     Physical Exam Constitutional: She appears well-developed and well-nourished. No distress.  HENT:  Head: Normocephalic and atraumatic.  Right Ear: External ear normal. Normal ear canal and TM Left Ear: External ear normal.  Normal ear canal and TM Mouth/Throat: Oropharynx is clear and moist.  Eyes: Conjunctivae and EOM are normal.  Neck: Neck supple. No tracheal deviation present. No thyromegaly present.  No carotid bruit  Cardiovascular: Normal rate, regular rhythm and normal heart sounds.   No murmur heard.  No edema. Pulmonary/Chest: Effort normal and breath sounds normal. No respiratory distress. She has no wheezes. She has no rales.  Breast: deferred   Abdominal: Soft. She exhibits no distension. There is no tenderness.  Lymphadenopathy: She  has no cervical adenopathy.  Skin: Skin is warm and dry. She is not diaphoretic.  Psychiatric: She has a normal mood and affect. Her behavior is normal.        Assessment & Plan:   Physical exam: Screening blood work    ordered Immunizations  Flu up to date, tdap today, discussed shingrix Colonoscopy  Up  to date  Mammogram  Up to date  Gyn  Up to date  Eye exams  Up to date  Exercise  Irregular - now doing it more regularly Weight  Overweight  Substance abuse   none  See Problem List for Assessment and Plan of chronic medical problems.    The 10-year ASCVD risk score Mikey Bussing DC Brooke Bonito., et al., 2013) is: 7.5%   Values used to calculate the score:     Age: 41 years     Sex: Female     Is Non-Hispanic African American: No     Diabetic: No     Tobacco smoker: Yes     Systolic Blood Pressure: 382 mmHg     Is BP treated: Yes     HDL Cholesterol: 54.9 mg/dL     Total Cholesterol: 209 mg/dL

## 2018-11-07 NOTE — Telephone Encounter (Signed)
Okay to get labs done prior.

## 2018-11-07 NOTE — Telephone Encounter (Signed)
Pt requested labs be placed before OV tomorrow.

## 2018-11-08 ENCOUNTER — Encounter: Payer: Self-pay | Admitting: Internal Medicine

## 2018-11-08 ENCOUNTER — Ambulatory Visit (INDEPENDENT_AMBULATORY_CARE_PROVIDER_SITE_OTHER): Payer: BC Managed Care – PPO | Admitting: Internal Medicine

## 2018-11-08 ENCOUNTER — Other Ambulatory Visit (INDEPENDENT_AMBULATORY_CARE_PROVIDER_SITE_OTHER): Payer: BC Managed Care – PPO

## 2018-11-08 ENCOUNTER — Ambulatory Visit (INDEPENDENT_AMBULATORY_CARE_PROVIDER_SITE_OTHER): Payer: BC Managed Care – PPO | Admitting: Psychology

## 2018-11-08 ENCOUNTER — Other Ambulatory Visit: Payer: Self-pay

## 2018-11-08 VITALS — BP 122/70 | HR 70 | Temp 98.5°F | Resp 16 | Ht 67.0 in | Wt 178.0 lb

## 2018-11-08 DIAGNOSIS — E782 Mixed hyperlipidemia: Secondary | ICD-10-CM

## 2018-11-08 DIAGNOSIS — Z0000 Encounter for general adult medical examination without abnormal findings: Secondary | ICD-10-CM | POA: Diagnosis not present

## 2018-11-08 DIAGNOSIS — R739 Hyperglycemia, unspecified: Secondary | ICD-10-CM

## 2018-11-08 DIAGNOSIS — E041 Nontoxic single thyroid nodule: Secondary | ICD-10-CM

## 2018-11-08 DIAGNOSIS — Z23 Encounter for immunization: Secondary | ICD-10-CM

## 2018-11-08 DIAGNOSIS — F4323 Adjustment disorder with mixed anxiety and depressed mood: Secondary | ICD-10-CM | POA: Diagnosis not present

## 2018-11-08 DIAGNOSIS — Z833 Family history of diabetes mellitus: Secondary | ICD-10-CM | POA: Diagnosis not present

## 2018-11-08 DIAGNOSIS — K219 Gastro-esophageal reflux disease without esophagitis: Secondary | ICD-10-CM

## 2018-11-08 DIAGNOSIS — I10 Essential (primary) hypertension: Secondary | ICD-10-CM

## 2018-11-08 DIAGNOSIS — R05 Cough: Secondary | ICD-10-CM

## 2018-11-08 DIAGNOSIS — R053 Chronic cough: Secondary | ICD-10-CM | POA: Insufficient documentation

## 2018-11-08 LAB — CBC WITH DIFFERENTIAL/PLATELET
Basophils Absolute: 0 10*3/uL (ref 0.0–0.1)
Basophils Relative: 0.4 % (ref 0.0–3.0)
Eosinophils Absolute: 0.1 10*3/uL (ref 0.0–0.7)
Eosinophils Relative: 1.6 % (ref 0.0–5.0)
HCT: 41.3 % (ref 36.0–46.0)
Hemoglobin: 14.3 g/dL (ref 12.0–15.0)
Lymphocytes Relative: 40.5 % (ref 12.0–46.0)
Lymphs Abs: 2.1 10*3/uL (ref 0.7–4.0)
MCHC: 34.6 g/dL (ref 30.0–36.0)
MCV: 85.4 fl (ref 78.0–100.0)
Monocytes Absolute: 0.4 10*3/uL (ref 0.1–1.0)
Monocytes Relative: 8.1 % (ref 3.0–12.0)
Neutro Abs: 2.5 10*3/uL (ref 1.4–7.7)
Neutrophils Relative %: 49.4 % (ref 43.0–77.0)
Platelets: 207 10*3/uL (ref 150.0–400.0)
RBC: 4.83 Mil/uL (ref 3.87–5.11)
RDW: 12.5 % (ref 11.5–15.5)
WBC: 5.1 10*3/uL (ref 4.0–10.5)

## 2018-11-08 LAB — LIPID PANEL
Cholesterol: 209 mg/dL — ABNORMAL HIGH (ref 0–200)
HDL: 54.9 mg/dL (ref >39.00)
LDL Cholesterol: 135 mg/dL — ABNORMAL HIGH (ref 0–99)
NonHDL: 153.67
Total CHOL/HDL Ratio: 4
Triglycerides: 91 mg/dL (ref 0.0–149.0)
VLDL: 18.2 mg/dL (ref 0.0–40.0)

## 2018-11-08 LAB — COMPREHENSIVE METABOLIC PANEL
ALT: 15 U/L (ref 0–35)
AST: 20 U/L (ref 0–37)
Albumin: 4.3 g/dL (ref 3.5–5.2)
Alkaline Phosphatase: 56 U/L (ref 39–117)
BUN: 14 mg/dL (ref 6–23)
CO2: 30 mEq/L (ref 19–32)
Calcium: 9.4 mg/dL (ref 8.4–10.5)
Chloride: 96 mEq/L (ref 96–112)
Creatinine, Ser: 0.74 mg/dL (ref 0.40–1.20)
GFR: 80.37 mL/min (ref >60.00)
Glucose, Bld: 105 mg/dL — ABNORMAL HIGH (ref 70–99)
Potassium: 3.4 mEq/L — ABNORMAL LOW (ref 3.5–5.1)
Sodium: 137 mEq/L (ref 135–145)
Total Bilirubin: 0.6 mg/dL (ref 0.2–1.2)
Total Protein: 7.5 g/dL (ref 6.0–8.3)

## 2018-11-08 LAB — HEMOGLOBIN A1C: Hgb A1c MFr Bld: 5.7 % (ref 4.6–6.5)

## 2018-11-08 LAB — TSH: TSH: 0.88 u[IU]/mL (ref 0.35–4.50)

## 2018-11-08 NOTE — Assessment & Plan Note (Signed)
Contributing to the cough Advised taking omeprazole for at least 2-4 more weeks She is revising diet May need to see GI  If no improvement

## 2018-11-08 NOTE — Assessment & Plan Note (Signed)
BP well controlled Current regimen effective and well tolerated Continue current medications at current doses cmp  

## 2018-11-08 NOTE — Assessment & Plan Note (Signed)
Has had cough for years Likely multifactorial - allergies and GERD are likely contributing ? Losartan contributing She will continue allegra and omeprazole ( take for at least 2-4 weeks) Saw ENT and has LPR - may need to see GI May need to change losartan

## 2018-11-08 NOTE — Assessment & Plan Note (Signed)
Check a1c Low sugar / carb diet Stressed regular exercise   

## 2018-11-08 NOTE — Assessment & Plan Note (Signed)
Check lipid panel, tsh , cmp Diet controlled Regular exercise and healthy diet encouraged  

## 2018-11-16 ENCOUNTER — Ambulatory Visit (INDEPENDENT_AMBULATORY_CARE_PROVIDER_SITE_OTHER): Payer: BC Managed Care – PPO | Admitting: Psychology

## 2018-11-16 DIAGNOSIS — F4323 Adjustment disorder with mixed anxiety and depressed mood: Secondary | ICD-10-CM | POA: Diagnosis not present

## 2018-11-21 ENCOUNTER — Other Ambulatory Visit: Payer: Self-pay | Admitting: Internal Medicine

## 2018-11-30 ENCOUNTER — Ambulatory Visit (INDEPENDENT_AMBULATORY_CARE_PROVIDER_SITE_OTHER): Payer: BC Managed Care – PPO | Admitting: Psychology

## 2018-11-30 DIAGNOSIS — F4323 Adjustment disorder with mixed anxiety and depressed mood: Secondary | ICD-10-CM | POA: Diagnosis not present

## 2018-12-06 ENCOUNTER — Ambulatory Visit (INDEPENDENT_AMBULATORY_CARE_PROVIDER_SITE_OTHER): Payer: BC Managed Care – PPO | Admitting: Psychology

## 2018-12-06 DIAGNOSIS — F4323 Adjustment disorder with mixed anxiety and depressed mood: Secondary | ICD-10-CM | POA: Diagnosis not present

## 2018-12-14 ENCOUNTER — Ambulatory Visit (INDEPENDENT_AMBULATORY_CARE_PROVIDER_SITE_OTHER): Payer: BC Managed Care – PPO | Admitting: Psychology

## 2018-12-14 DIAGNOSIS — F4323 Adjustment disorder with mixed anxiety and depressed mood: Secondary | ICD-10-CM | POA: Diagnosis not present

## 2018-12-28 ENCOUNTER — Ambulatory Visit (INDEPENDENT_AMBULATORY_CARE_PROVIDER_SITE_OTHER): Payer: BC Managed Care – PPO | Admitting: Psychology

## 2018-12-28 DIAGNOSIS — F4323 Adjustment disorder with mixed anxiety and depressed mood: Secondary | ICD-10-CM

## 2019-01-11 ENCOUNTER — Ambulatory Visit (INDEPENDENT_AMBULATORY_CARE_PROVIDER_SITE_OTHER): Payer: BC Managed Care – PPO | Admitting: Psychology

## 2019-01-11 DIAGNOSIS — F4323 Adjustment disorder with mixed anxiety and depressed mood: Secondary | ICD-10-CM

## 2019-01-24 ENCOUNTER — Other Ambulatory Visit: Payer: Self-pay | Admitting: Internal Medicine

## 2019-01-24 ENCOUNTER — Encounter: Payer: Self-pay | Admitting: Internal Medicine

## 2019-01-25 ENCOUNTER — Ambulatory Visit: Payer: BC Managed Care – PPO | Admitting: Psychology

## 2019-02-08 ENCOUNTER — Ambulatory Visit: Payer: BC Managed Care – PPO | Admitting: Psychology

## 2019-02-22 ENCOUNTER — Ambulatory Visit (INDEPENDENT_AMBULATORY_CARE_PROVIDER_SITE_OTHER): Payer: BC Managed Care – PPO | Admitting: Psychology

## 2019-02-22 DIAGNOSIS — F4323 Adjustment disorder with mixed anxiety and depressed mood: Secondary | ICD-10-CM | POA: Diagnosis not present

## 2019-02-28 ENCOUNTER — Ambulatory Visit (INDEPENDENT_AMBULATORY_CARE_PROVIDER_SITE_OTHER): Payer: BC Managed Care – PPO | Admitting: Psychology

## 2019-02-28 DIAGNOSIS — F4323 Adjustment disorder with mixed anxiety and depressed mood: Secondary | ICD-10-CM

## 2019-03-02 ENCOUNTER — Encounter: Payer: Self-pay | Admitting: Internal Medicine

## 2019-03-08 ENCOUNTER — Ambulatory Visit (INDEPENDENT_AMBULATORY_CARE_PROVIDER_SITE_OTHER): Payer: BC Managed Care – PPO | Admitting: Psychology

## 2019-03-08 DIAGNOSIS — F4323 Adjustment disorder with mixed anxiety and depressed mood: Secondary | ICD-10-CM | POA: Diagnosis not present

## 2019-03-15 ENCOUNTER — Ambulatory Visit (INDEPENDENT_AMBULATORY_CARE_PROVIDER_SITE_OTHER): Payer: BC Managed Care – PPO | Admitting: Psychology

## 2019-03-15 DIAGNOSIS — F4323 Adjustment disorder with mixed anxiety and depressed mood: Secondary | ICD-10-CM | POA: Diagnosis not present

## 2019-03-22 ENCOUNTER — Ambulatory Visit (INDEPENDENT_AMBULATORY_CARE_PROVIDER_SITE_OTHER): Payer: BC Managed Care – PPO | Admitting: Psychology

## 2019-03-22 DIAGNOSIS — F4323 Adjustment disorder with mixed anxiety and depressed mood: Secondary | ICD-10-CM | POA: Diagnosis not present

## 2019-03-28 ENCOUNTER — Ambulatory Visit (INDEPENDENT_AMBULATORY_CARE_PROVIDER_SITE_OTHER): Payer: BC Managed Care – PPO | Admitting: Psychology

## 2019-03-28 DIAGNOSIS — F4323 Adjustment disorder with mixed anxiety and depressed mood: Secondary | ICD-10-CM | POA: Diagnosis not present

## 2019-04-02 LAB — HM PAP SMEAR

## 2019-04-05 ENCOUNTER — Ambulatory Visit (INDEPENDENT_AMBULATORY_CARE_PROVIDER_SITE_OTHER): Payer: BC Managed Care – PPO | Admitting: Psychology

## 2019-04-05 DIAGNOSIS — F4323 Adjustment disorder with mixed anxiety and depressed mood: Secondary | ICD-10-CM

## 2019-04-19 ENCOUNTER — Ambulatory Visit: Payer: BC Managed Care – PPO | Admitting: Psychology

## 2019-05-03 ENCOUNTER — Ambulatory Visit: Payer: BC Managed Care – PPO | Admitting: Psychology

## 2019-05-16 ENCOUNTER — Ambulatory Visit (INDEPENDENT_AMBULATORY_CARE_PROVIDER_SITE_OTHER): Payer: BC Managed Care – PPO | Admitting: Psychology

## 2019-05-16 DIAGNOSIS — F4323 Adjustment disorder with mixed anxiety and depressed mood: Secondary | ICD-10-CM | POA: Diagnosis not present

## 2019-05-17 ENCOUNTER — Ambulatory Visit: Payer: BC Managed Care – PPO | Admitting: Psychology

## 2019-05-23 ENCOUNTER — Ambulatory Visit (INDEPENDENT_AMBULATORY_CARE_PROVIDER_SITE_OTHER): Payer: BC Managed Care – PPO | Admitting: Psychology

## 2019-05-23 DIAGNOSIS — F4323 Adjustment disorder with mixed anxiety and depressed mood: Secondary | ICD-10-CM | POA: Diagnosis not present

## 2019-05-31 ENCOUNTER — Ambulatory Visit (INDEPENDENT_AMBULATORY_CARE_PROVIDER_SITE_OTHER): Payer: BC Managed Care – PPO | Admitting: Psychology

## 2019-05-31 DIAGNOSIS — F4323 Adjustment disorder with mixed anxiety and depressed mood: Secondary | ICD-10-CM | POA: Diagnosis not present

## 2019-06-02 ENCOUNTER — Other Ambulatory Visit: Payer: Self-pay | Admitting: Internal Medicine

## 2019-06-14 ENCOUNTER — Ambulatory Visit (INDEPENDENT_AMBULATORY_CARE_PROVIDER_SITE_OTHER): Payer: BC Managed Care – PPO | Admitting: Psychology

## 2019-06-14 DIAGNOSIS — F4323 Adjustment disorder with mixed anxiety and depressed mood: Secondary | ICD-10-CM

## 2019-06-28 ENCOUNTER — Ambulatory Visit (INDEPENDENT_AMBULATORY_CARE_PROVIDER_SITE_OTHER): Payer: BC Managed Care – PPO | Admitting: Psychology

## 2019-06-28 DIAGNOSIS — F4323 Adjustment disorder with mixed anxiety and depressed mood: Secondary | ICD-10-CM

## 2019-07-12 ENCOUNTER — Ambulatory Visit (INDEPENDENT_AMBULATORY_CARE_PROVIDER_SITE_OTHER): Payer: BC Managed Care – PPO | Admitting: Psychology

## 2019-07-12 DIAGNOSIS — F4323 Adjustment disorder with mixed anxiety and depressed mood: Secondary | ICD-10-CM

## 2019-07-26 ENCOUNTER — Ambulatory Visit (INDEPENDENT_AMBULATORY_CARE_PROVIDER_SITE_OTHER): Payer: BC Managed Care – PPO | Admitting: Psychology

## 2019-07-26 DIAGNOSIS — F4323 Adjustment disorder with mixed anxiety and depressed mood: Secondary | ICD-10-CM | POA: Diagnosis not present

## 2019-08-01 ENCOUNTER — Ambulatory Visit (INDEPENDENT_AMBULATORY_CARE_PROVIDER_SITE_OTHER): Payer: BC Managed Care – PPO | Admitting: Psychology

## 2019-08-01 DIAGNOSIS — F4323 Adjustment disorder with mixed anxiety and depressed mood: Secondary | ICD-10-CM | POA: Diagnosis not present

## 2019-08-09 ENCOUNTER — Ambulatory Visit (INDEPENDENT_AMBULATORY_CARE_PROVIDER_SITE_OTHER): Payer: BC Managed Care – PPO | Admitting: Psychology

## 2019-08-09 DIAGNOSIS — F4323 Adjustment disorder with mixed anxiety and depressed mood: Secondary | ICD-10-CM

## 2019-08-10 ENCOUNTER — Telehealth: Payer: Self-pay | Admitting: Internal Medicine

## 2019-08-10 DIAGNOSIS — I10 Essential (primary) hypertension: Secondary | ICD-10-CM

## 2019-08-10 DIAGNOSIS — R739 Hyperglycemia, unspecified: Secondary | ICD-10-CM

## 2019-08-10 DIAGNOSIS — E782 Mixed hyperlipidemia: Secondary | ICD-10-CM

## 2019-08-10 DIAGNOSIS — Z8639 Personal history of other endocrine, nutritional and metabolic disease: Secondary | ICD-10-CM

## 2019-08-10 DIAGNOSIS — Z0000 Encounter for general adult medical examination without abnormal findings: Secondary | ICD-10-CM

## 2019-08-10 NOTE — Telephone Encounter (Signed)
ordered

## 2019-08-10 NOTE — Telephone Encounter (Signed)
New message:   Pt is calling and states she would like to have her labs done a couple of days before her CPE appt on 11/09/19. Pt would like a call when the orders have been placed. Please advise.

## 2019-08-10 NOTE — Telephone Encounter (Signed)
My chart message sent to patient that I will call her in September to set up lab appointment prior to visit.

## 2019-08-23 ENCOUNTER — Ambulatory Visit (INDEPENDENT_AMBULATORY_CARE_PROVIDER_SITE_OTHER): Payer: BC Managed Care – PPO | Admitting: Psychology

## 2019-08-23 DIAGNOSIS — F4323 Adjustment disorder with mixed anxiety and depressed mood: Secondary | ICD-10-CM | POA: Diagnosis not present

## 2019-08-30 ENCOUNTER — Ambulatory Visit (INDEPENDENT_AMBULATORY_CARE_PROVIDER_SITE_OTHER): Payer: BC Managed Care – PPO | Admitting: Psychology

## 2019-08-30 DIAGNOSIS — F4323 Adjustment disorder with mixed anxiety and depressed mood: Secondary | ICD-10-CM

## 2019-09-06 ENCOUNTER — Ambulatory Visit: Payer: BC Managed Care – PPO | Admitting: Psychology

## 2019-09-20 ENCOUNTER — Ambulatory Visit (INDEPENDENT_AMBULATORY_CARE_PROVIDER_SITE_OTHER): Payer: BC Managed Care – PPO | Admitting: Psychology

## 2019-09-20 DIAGNOSIS — F4323 Adjustment disorder with mixed anxiety and depressed mood: Secondary | ICD-10-CM | POA: Diagnosis not present

## 2019-10-04 ENCOUNTER — Ambulatory Visit (INDEPENDENT_AMBULATORY_CARE_PROVIDER_SITE_OTHER): Payer: BC Managed Care – PPO | Admitting: Psychology

## 2019-10-04 DIAGNOSIS — F4323 Adjustment disorder with mixed anxiety and depressed mood: Secondary | ICD-10-CM

## 2019-10-18 ENCOUNTER — Ambulatory Visit (INDEPENDENT_AMBULATORY_CARE_PROVIDER_SITE_OTHER): Payer: BC Managed Care – PPO | Admitting: Psychology

## 2019-10-18 DIAGNOSIS — F4323 Adjustment disorder with mixed anxiety and depressed mood: Secondary | ICD-10-CM

## 2019-10-29 ENCOUNTER — Other Ambulatory Visit: Payer: Self-pay | Admitting: Internal Medicine

## 2019-11-01 ENCOUNTER — Ambulatory Visit (INDEPENDENT_AMBULATORY_CARE_PROVIDER_SITE_OTHER): Payer: BC Managed Care – PPO | Admitting: Psychology

## 2019-11-01 DIAGNOSIS — F4323 Adjustment disorder with mixed anxiety and depressed mood: Secondary | ICD-10-CM

## 2019-11-03 ENCOUNTER — Other Ambulatory Visit: Payer: Self-pay

## 2019-11-03 ENCOUNTER — Other Ambulatory Visit: Payer: BC Managed Care – PPO

## 2019-11-03 DIAGNOSIS — I10 Essential (primary) hypertension: Secondary | ICD-10-CM

## 2019-11-03 DIAGNOSIS — Z8639 Personal history of other endocrine, nutritional and metabolic disease: Secondary | ICD-10-CM

## 2019-11-03 DIAGNOSIS — R739 Hyperglycemia, unspecified: Secondary | ICD-10-CM

## 2019-11-03 DIAGNOSIS — E782 Mixed hyperlipidemia: Secondary | ICD-10-CM

## 2019-11-03 DIAGNOSIS — Z0000 Encounter for general adult medical examination without abnormal findings: Secondary | ICD-10-CM

## 2019-11-03 NOTE — Addendum Note (Signed)
Addended by: Carlyon Prows on: 11/03/2019 08:03 AM   Modules accepted: Orders

## 2019-11-03 NOTE — Addendum Note (Signed)
Addended by: Jonthan Leite on: 11/03/2019 08:03 AM   Modules accepted: Orders  

## 2019-11-03 NOTE — Addendum Note (Signed)
Addended by: Rhiana Morash on: 11/03/2019 08:03 AM   Modules accepted: Orders  

## 2019-11-03 NOTE — Addendum Note (Signed)
Addended by: Carlyon Prows on: 11/03/2019 08:04 AM   Modules accepted: Orders

## 2019-11-03 NOTE — Addendum Note (Signed)
Addended by: Aisia Correira on: 11/03/2019 08:03 AM   Modules accepted: Orders  

## 2019-11-04 ENCOUNTER — Encounter: Payer: Self-pay | Admitting: Internal Medicine

## 2019-11-04 LAB — CBC WITH DIFFERENTIAL/PLATELET
Absolute Monocytes: 332 cells/uL (ref 200–950)
Basophils Absolute: 29 cells/uL (ref 0–200)
Basophils Relative: 0.7 %
Eosinophils Absolute: 49 cells/uL (ref 15–500)
Eosinophils Relative: 1.2 %
HCT: 42.9 % (ref 35.0–45.0)
Hemoglobin: 14.7 g/dL (ref 11.7–15.5)
Lymphs Abs: 1665 cells/uL (ref 850–3900)
MCH: 29.8 pg (ref 27.0–33.0)
MCHC: 34.3 g/dL (ref 32.0–36.0)
MCV: 86.8 fL (ref 80.0–100.0)
MPV: 12 fL (ref 7.5–12.5)
Monocytes Relative: 8.1 %
Neutro Abs: 2025 cells/uL (ref 1500–7800)
Neutrophils Relative %: 49.4 %
Platelets: 185 10*3/uL (ref 140–400)
RBC: 4.94 10*6/uL (ref 3.80–5.10)
RDW: 12.3 % (ref 11.0–15.0)
Total Lymphocyte: 40.6 %
WBC: 4.1 10*3/uL (ref 3.8–10.8)

## 2019-11-04 LAB — LIPID PANEL
Cholesterol: 191 mg/dL (ref <200)
HDL: 54 mg/dL (ref >=50)
LDL Cholesterol (Calc): 118 mg/dL (calc) — ABNORMAL HIGH
Non-HDL Cholesterol (Calc): 137 mg/dL (calc) — ABNORMAL HIGH (ref <130)
Total CHOL/HDL Ratio: 3.5 (calc) (ref <5.0)
Triglycerides: 86 mg/dL (ref <150)

## 2019-11-04 LAB — COMPREHENSIVE METABOLIC PANEL
AG Ratio: 1.4 (calc) (ref 1.0–2.5)
ALT: 14 U/L (ref 6–29)
AST: 20 U/L (ref 10–35)
Albumin: 4.2 g/dL (ref 3.6–5.1)
Alkaline phosphatase (APISO): 52 U/L (ref 37–153)
BUN: 11 mg/dL (ref 7–25)
CO2: 34 mmol/L — ABNORMAL HIGH (ref 20–32)
Calcium: 9.7 mg/dL (ref 8.6–10.4)
Chloride: 96 mmol/L — ABNORMAL LOW (ref 98–110)
Creat: 0.73 mg/dL (ref 0.50–1.05)
Globulin: 2.9 g/dL (calc) (ref 1.9–3.7)
Glucose, Bld: 95 mg/dL (ref 65–99)
Potassium: 3.8 mmol/L (ref 3.5–5.3)
Sodium: 139 mmol/L (ref 135–146)
Total Bilirubin: 0.8 mg/dL (ref 0.2–1.2)
Total Protein: 7.1 g/dL (ref 6.1–8.1)

## 2019-11-04 LAB — HEMOGLOBIN A1C
Hgb A1c MFr Bld: 5.5 % of total Hgb (ref <5.7)
Mean Plasma Glucose: 111 (calc)
eAG (mmol/L): 6.2 (calc)

## 2019-11-04 LAB — IRON,TIBC AND FERRITIN PANEL
%SAT: 22 % (calc) (ref 16–45)
Ferritin: 424 ng/mL — ABNORMAL HIGH (ref 16–232)
Iron: 70 ug/dL (ref 45–160)
TIBC: 325 mcg/dL (calc) (ref 250–450)

## 2019-11-04 LAB — TSH: TSH: 0.81 mIU/L (ref 0.40–4.50)

## 2019-11-08 NOTE — Progress Notes (Signed)
Subjective:    Patient ID: Kathleen Weber, female    DOB: [birth year], 59 y.o.   MRN: [id number]  HPI She is here for a physical exam.   ? Stop or dec iron intake -  She started the iron years ago for low iron - likely related to low dietary intake.  Her ferritin level was on the higher side and she wondered if she should stop taking her iron supplementation   Black and blue on leg that is not going away.  There is no pain.  Just a little brownish coloration.   Medications and allergies reviewed with patient and updated if appropriate.  Patient Active Problem List   Diagnosis Date Noted   Low iron 11/09/2019   Chronic cough 11/08/2018   Laryngopharyngeal reflux (LPR) 10/04/2018   Racing heart beat 10/22/2017   Family history of diabetes mellitus 10/19/2017   Hyperglycemia 10/01/2016   Carpal tunnel syndrome, bilateral 02/22/2016   IBS (irritable bowel syndrome) 08/05/2015   Colitis 05/15/2015   Hyperlipidemia 05/15/2015   Hypertension 12/19/2012   Allergic rhinitis 12/19/2012   Thyroid nodule    Bilateral hand numbness    Overweight     Current Outpatient Medications on File Prior to Visit  Medication Sig Dispense Refill   amLODipine (NORVASC) 5 MG tablet TAKE 1 TABLET BY MOUTH EVERYDAY AT BEDTIME 30 tablet 0   Biotin 5 MG CAPS Take by mouth. Take 3 capsule daily     Ferrous Sulfate (IRON) 325 (65 FE) MG TABS Take 1 tablet by mouth daily.     hydrochlorothiazide (MICROZIDE) 12.5 MG capsule TAKE 1 CAPSULE BY MOUTH TWICE A DAY 180 capsule 1   losartan (COZAAR) 100 MG tablet Take 1 tablet (100 mg total) by mouth daily. Must keep scheduled appt for new script for 90 day 30 tablet 0   Probiotic Product (PROBIOTIC DAILY PO) Take by mouth daily.     No current facility-administered medications on file prior to visit.    Past Medical History:  Diagnosis Date   Arthritis    Chicken pox    Gastritis    Hypertension    Neuropathy    Thyroid  nodule     Past Surgical History:  Procedure Laterality Date   neg hx      Social History   Socioeconomic History   Marital status: Married    Spouse name: Not on file   Number of children: 2   Years of education: Not on file   Highest education level: Not on file  Occupational History   Occupation: Magazine features editor: [employer]  Tobacco Use   Smoking status: Never Smoker   Smokeless tobacco: Never Used  Substance and Sexual Activity   Alcohol use: No   Drug use: No   Sexual activity: Not on file  Other Topics Concern   Not on file  Social History Narrative   Not on file   Social Determinants of Health   Financial Resource Strain:    Difficulty of Paying Living Expenses: Not on file  Food Insecurity:    Worried About Programme researcher, broadcasting/film/video in the Last Year: Not on file   [employer] of Food in the Last Year: Not on file  Transportation Needs:    Lack of Transportation (Medical): Not on file   Lack of Transportation (Non-Medical): Not on file  Physical Activity:    Days of Exercise per Week: Not on file   Minutes of  Exercise per Session: Not on file  Stress:    Feeling of Stress : Not on file  Social Connections:    Frequency of Communication with Friends and Family: Not on file   Frequency of Social Gatherings with Friends and Family: Not on file   Attends Religious Services: Not on file   Active Member of Clubs or Organizations: Not on file   Attends Banker Meetings: Not on file   Marital Status: Not on file    Family History  Problem Relation Age of Onset   Stomach cancer Mother    Heart disease Mother    Kidney disease Mother        kidney cancer   Hypertension Mother    Diabetes Father    Coronary artery disease Father 56       CABGx5   Stroke Father        TIAs   Hypertension Father    Lymphoma Maternal Grandmother    Colon cancer Neg Hx     Review of Systems  Constitutional:  Negative for chills and fever.  Eyes: Negative for visual disturbance.  Respiratory: Negative for cough, shortness of breath and wheezing.   Cardiovascular: Negative for chest pain, palpitations and leg swelling.  Gastrointestinal: Negative for abdominal pain, blood in stool, constipation, diarrhea and nausea.       Rare GERD  Genitourinary: Negative for dysuria and hematuria.  Musculoskeletal: Positive for arthralgias (mild arthritis) and back pain (sciatica).  Skin: Positive for color change (black and blue). Negative for rash.  Neurological: Negative for dizziness, light-headedness and headaches.  Psychiatric/Behavioral: Positive for dysphoric mood (mild). The patient is not nervous/anxious.        Objective:   Vitals:   11/09/19 0903  BP: 110/72  Pulse: 86  Temp: 98.3 F (36.8 C)  SpO2: 96%   Filed Weights   11/09/19 0903  Weight: 174 lb 6.4 oz (79.1 kg)   Body mass index is 27.31 kg/m.  BP Readings from Last 3 Encounters:  11/09/19 110/72  11/08/18 122/70  03/08/18 100/70    Wt Readings from Last 3 Encounters:  11/09/19 174 lb 6.4 oz (79.1 kg)  11/08/18 178 lb (80.7 kg)  03/08/18 170 lb 3.2 oz (77.2 kg)     Physical Exam Constitutional: She appears well-developed and well-nourished. No distress.  HENT:  Head: Normocephalic and atraumatic.  Right Ear: External ear normal. Normal ear canal and TM Left Ear: External ear normal.  Normal ear canal and TM Mouth/Throat: Oropharynx is clear and moist.  Eyes: Conjunctivae and EOM are normal.  Neck: Neck supple. No tracheal deviation present. No thyromegaly present.  No carotid bruit  Cardiovascular: Normal rate, regular rhythm and normal heart sounds.   No murmur heard.  No edema. Pulmonary/Chest: Effort normal and breath sounds normal. No respiratory distress. She has no wheezes. She has no rales.  Breast: deferred   Abdominal: Soft. She exhibits no distension. There is no tenderness.  Lymphadenopathy: She has  no cervical adenopathy.  Skin: Skin is warm and dry. She is not diaphoretic.  Psychiatric: She has a normal mood and affect. Her behavior is normal.   The 10-year ASCVD risk score Denman George DC Jr., et al., 2013) is: 6.2%   Values used to calculate the score:     Age: 31 years     Sex: Female     Is Non-Hispanic African American: No     Diabetic: No     Tobacco smoker: Yes  Systolic Blood Pressure: 110 mmHg     Is BP treated: Yes     HDL Cholesterol: 54 mg/dL     Total Cholesterol: 191 mg/dL      Assessment & Plan:   Physical exam: Screening blood work    reviewed Immunizations  Flu vaccine today, had covid vaccine, discussed shingrix Colonoscopy  Up to date  Mammogram  Up to date  Gyn  Up to date  Eye exams  Up to date  Exercise  regular Weight went fairly good for age.  She is working on weight loss. Substance abuse  none  See Problem List for Assessment and Plan of chronic medical problems.   This visit occurred during the SARS-CoV-2 public health emergency.  Safety protocols were in place, including screening questions prior to the visit, additional usage of staff PPE, and extensive cleaning of exam room while observing appropriate contact time as indicated for disinfecting solutions.

## 2019-11-08 NOTE — Patient Instructions (Addendum)
All other Health Maintenance issues reviewed.   All recommended immunizations and age-appropriate screenings are up-to-date or discussed.  Flu immunization administered today.    Medications reviewed and updated.  Changes include :   none  Your prescription(s) have been submitted to your pharmacy. Please take as directed and contact our office if you believe you are having problem(s) with the medication(s).    Please followup in 1 year   Health Maintenance, Female Adopting a healthy lifestyle and getting preventive care are important in promoting health and wellness. Ask your health care provider about:  The right schedule for you to have regular tests and exams.  Things you can do on your own to prevent diseases and keep yourself healthy. What should I know about diet, weight, and exercise? Eat a healthy diet   Eat a diet that includes plenty of vegetables, fruits, low-fat dairy products, and lean protein.  Do not eat a lot of foods that are high in solid fats, added sugars, or sodium. Maintain a healthy weight Body mass index (BMI) is used to identify weight problems. It estimates body fat based on height and weight. Your health care provider can help determine your BMI and help you achieve or maintain a healthy weight. Get regular exercise Get regular exercise. This is one of the most important things you can do for your health. Most adults should:  Exercise for at least 150 minutes each week. The exercise should increase your heart rate and make you sweat (moderate-intensity exercise).  Do strengthening exercises at least twice a week. This is in addition to the moderate-intensity exercise.  Spend less time sitting. Even light physical activity can be beneficial. Watch cholesterol and blood lipids Have your blood tested for lipids and cholesterol at 60 years of age, then have this test every 5 years. Have your cholesterol levels checked more often if:  Your lipid or  cholesterol levels are high.  You are older than 60 years of age.  You are at high risk for heart disease. What should I know about cancer screening? Depending on your health history and family history, you may need to have cancer screening at various ages. This may include screening for:  Breast cancer.  Cervical cancer.  Colorectal cancer.  Skin cancer.  Lung cancer. What should I know about heart disease, diabetes, and high blood pressure? Blood pressure and heart disease  High blood pressure causes heart disease and increases the risk of stroke. This is more likely to develop in people who have high blood pressure readings, are of African descent, or are overweight.  Have your blood pressure checked: ? Every 3-5 years if you are 84-31 years of age. ? Every year if you are 46 years old or older. Diabetes Have regular diabetes screenings. This checks your fasting blood sugar level. Have the screening done:  Once every three years after age 29 if you are at a normal weight and have a low risk for diabetes.  More often and at a younger age if you are overweight or have a high risk for diabetes. What should I know about preventing infection? Hepatitis B If you have a higher risk for hepatitis B, you should be screened for this virus. Talk with your health care provider to find out if you are at risk for hepatitis B infection. Hepatitis C Testing is recommended for:  Everyone born from [birth year] through 1966.  Anyone with known risk factors for hepatitis C. Sexually transmitted infections (STIs)  Get screened for STIs, including gonorrhea and chlamydia, if: ? You are sexually active and are younger than 60 years of age. ? You are older than 60 years of age and your health care provider tells you that you are at risk for this type of infection. ? Your sexual activity has changed since you were last screened, and you are at increased risk for chlamydia or gonorrhea. Ask your  health care provider if you are at risk.  Ask your health care provider about whether you are at high risk for HIV. Your health care provider may recommend a prescription medicine to help prevent HIV infection. If you choose to take medicine to prevent HIV, you should first get tested for HIV. You should then be tested every 3 months for as long as you are taking the medicine. Pregnancy  If you are about to stop having your period (premenopausal) and you may become pregnant, seek counseling before you get pregnant.  Take 400 to 800 micrograms (mcg) of folic acid every day if you become pregnant.  Ask for birth control (contraception) if you want to prevent pregnancy. Osteoporosis and menopause Osteoporosis is a disease in which the bones lose minerals and strength with aging. This can result in bone fractures. If you are 76 years old or older, or if you are at risk for osteoporosis and fractures, ask your health care provider if you should:  Be screened for bone loss.  Take a calcium or vitamin D supplement to lower your risk of fractures.  Be given hormone replacement therapy (HRT) to treat symptoms of menopause. Follow these instructions at home: Lifestyle  Do not use any products that contain nicotine or tobacco, such as cigarettes, e-cigarettes, and chewing tobacco. If you need help quitting, ask your health care provider.  Do not use street drugs.  Do not share needles.  Ask your health care provider for help if you need support or information about quitting drugs. Alcohol use  Do not drink alcohol if: ? Your health care provider tells you not to drink. ? You are pregnant, may be pregnant, or are planning to become pregnant.  If you drink alcohol: ? Limit how much you use to 0-1 drink a day. ? Limit intake if you are breastfeeding.  Be aware of how much alcohol is in your drink. In the [place] one drink equals one 12 oz bottle of beer (355 mL), one 5 oz glass of wine (148  mL), or one 1 oz glass of hard liquor (44 mL). General instructions  Schedule regular health, dental, and eye exams.  Stay current with your vaccines.  Tell your health care provider if: ? You often feel depressed. ? You have ever been abused or do not feel safe at home. Summary  Adopting a healthy lifestyle and getting preventive care are important in promoting health and wellness.  Follow your health care provider's instructions about healthy diet, exercising, and getting tested or screened for diseases.  Follow your health care provider's instructions on monitoring your cholesterol and blood pressure. This information is not intended to replace advice given to you by your health care provider. Make sure you discuss any questions you have with your health care provider. Document Revised: 01/19/2018 Document Reviewed: 01/19/2018 Elsevier Patient Education  2020 [employer].

## 2019-11-09 ENCOUNTER — Encounter: Payer: Self-pay | Admitting: Internal Medicine

## 2019-11-09 ENCOUNTER — Ambulatory Visit (INDEPENDENT_AMBULATORY_CARE_PROVIDER_SITE_OTHER): Payer: BC Managed Care – PPO | Admitting: Internal Medicine

## 2019-11-09 ENCOUNTER — Other Ambulatory Visit: Payer: Self-pay

## 2019-11-09 VITALS — BP 110/72 | HR 86 | Temp 98.3°F | Wt 174.4 lb

## 2019-11-09 DIAGNOSIS — E611 Iron deficiency: Secondary | ICD-10-CM

## 2019-11-09 DIAGNOSIS — Z0000 Encounter for general adult medical examination without abnormal findings: Secondary | ICD-10-CM | POA: Diagnosis not present

## 2019-11-09 DIAGNOSIS — K219 Gastro-esophageal reflux disease without esophagitis: Secondary | ICD-10-CM | POA: Diagnosis not present

## 2019-11-09 DIAGNOSIS — I10 Essential (primary) hypertension: Secondary | ICD-10-CM | POA: Diagnosis not present

## 2019-11-09 DIAGNOSIS — E663 Overweight: Secondary | ICD-10-CM

## 2019-11-09 DIAGNOSIS — R739 Hyperglycemia, unspecified: Secondary | ICD-10-CM | POA: Diagnosis not present

## 2019-11-09 DIAGNOSIS — E782 Mixed hyperlipidemia: Secondary | ICD-10-CM

## 2019-11-09 DIAGNOSIS — Z23 Encounter for immunization: Secondary | ICD-10-CM | POA: Diagnosis not present

## 2019-11-09 MED ORDER — AMLODIPINE BESYLATE 5 MG PO TABS
ORAL_TABLET | ORAL | 3 refills | Status: DC
Start: 2019-11-09 — End: 2020-10-28

## 2019-11-09 MED ORDER — LOSARTAN POTASSIUM 100 MG PO TABS
100.0000 mg | ORAL_TABLET | Freq: Every day | ORAL | 3 refills | Status: DC
Start: 2019-11-09 — End: 2020-07-26

## 2019-11-09 MED ORDER — HYDROCHLOROTHIAZIDE 12.5 MG PO CAPS
12.5000 mg | ORAL_CAPSULE | Freq: Two times a day (BID) | ORAL | 3 refills | Status: DC
Start: 2019-11-09 — End: 2020-10-28

## 2019-11-09 NOTE — Assessment & Plan Note (Signed)
Chronic Diet controlled Low ASCVD risk Continue healthy diet and regular exercise-no need for medication

## 2019-11-09 NOTE — Assessment & Plan Note (Signed)
Chronic Slightly overweight Overall weight seems to be good for age Continue healthy diet and regular exercise

## 2019-11-09 NOTE — Assessment & Plan Note (Signed)
Chronic, intermittent Rarely has GERD Take something over-the-counter if needed

## 2019-11-09 NOTE — Assessment & Plan Note (Signed)
Chronic Controlled Continue amlodipine 5 mg daily, hydrochlorothiazide 12.5 mg twice daily and losartan 100 mg daily CMP reviewed-within normal limits

## 2019-11-09 NOTE — Assessment & Plan Note (Signed)
Chronic Likely related to low dietary intake due to being a vegetarian Ferritin level elevated, iron level in the normal range Advised that she can decrease iron supplementation to 2-3 times a week We will recheck in 1 year

## 2019-11-09 NOTE — Assessment & Plan Note (Signed)
Chronic Family history of diabetes. Sugars have been in the normal range Continue healthy diet and regular exercise

## 2019-11-20 ENCOUNTER — Ambulatory Visit (INDEPENDENT_AMBULATORY_CARE_PROVIDER_SITE_OTHER): Payer: BC Managed Care – PPO | Admitting: Psychology

## 2019-11-20 DIAGNOSIS — F4323 Adjustment disorder with mixed anxiety and depressed mood: Secondary | ICD-10-CM

## 2019-11-29 ENCOUNTER — Ambulatory Visit (INDEPENDENT_AMBULATORY_CARE_PROVIDER_SITE_OTHER): Payer: BC Managed Care – PPO | Admitting: Psychology

## 2019-11-29 DIAGNOSIS — F4323 Adjustment disorder with mixed anxiety and depressed mood: Secondary | ICD-10-CM | POA: Diagnosis not present

## 2019-12-13 ENCOUNTER — Ambulatory Visit: Payer: BC Managed Care – PPO | Admitting: Psychology

## 2019-12-20 ENCOUNTER — Ambulatory Visit (INDEPENDENT_AMBULATORY_CARE_PROVIDER_SITE_OTHER): Payer: BC Managed Care – PPO | Admitting: Psychology

## 2019-12-20 DIAGNOSIS — F4323 Adjustment disorder with mixed anxiety and depressed mood: Secondary | ICD-10-CM | POA: Diagnosis not present

## 2019-12-27 ENCOUNTER — Ambulatory Visit (INDEPENDENT_AMBULATORY_CARE_PROVIDER_SITE_OTHER): Payer: BC Managed Care – PPO | Admitting: Psychology

## 2019-12-27 DIAGNOSIS — F4323 Adjustment disorder with mixed anxiety and depressed mood: Secondary | ICD-10-CM | POA: Diagnosis not present

## 2020-01-01 ENCOUNTER — Ambulatory Visit (INDEPENDENT_AMBULATORY_CARE_PROVIDER_SITE_OTHER): Payer: BC Managed Care – PPO | Admitting: Psychology

## 2020-01-01 DIAGNOSIS — F4323 Adjustment disorder with mixed anxiety and depressed mood: Secondary | ICD-10-CM

## 2020-01-10 ENCOUNTER — Ambulatory Visit (INDEPENDENT_AMBULATORY_CARE_PROVIDER_SITE_OTHER): Payer: BC Managed Care – PPO | Admitting: Psychology

## 2020-01-10 DIAGNOSIS — F4323 Adjustment disorder with mixed anxiety and depressed mood: Secondary | ICD-10-CM

## 2020-01-17 ENCOUNTER — Ambulatory Visit (INDEPENDENT_AMBULATORY_CARE_PROVIDER_SITE_OTHER): Payer: BC Managed Care – PPO | Admitting: Psychology

## 2020-01-17 DIAGNOSIS — F4323 Adjustment disorder with mixed anxiety and depressed mood: Secondary | ICD-10-CM | POA: Diagnosis not present

## 2020-01-23 ENCOUNTER — Encounter: Payer: Self-pay | Admitting: Internal Medicine

## 2020-01-24 ENCOUNTER — Ambulatory Visit (INDEPENDENT_AMBULATORY_CARE_PROVIDER_SITE_OTHER): Payer: BC Managed Care – PPO | Admitting: Psychology

## 2020-01-24 DIAGNOSIS — F4323 Adjustment disorder with mixed anxiety and depressed mood: Secondary | ICD-10-CM

## 2020-01-29 ENCOUNTER — Ambulatory Visit (INDEPENDENT_AMBULATORY_CARE_PROVIDER_SITE_OTHER): Payer: BC Managed Care – PPO | Admitting: Psychology

## 2020-01-29 DIAGNOSIS — F4323 Adjustment disorder with mixed anxiety and depressed mood: Secondary | ICD-10-CM

## 2020-02-07 ENCOUNTER — Ambulatory Visit: Payer: BC Managed Care – PPO | Admitting: Psychology

## 2020-02-21 ENCOUNTER — Ambulatory Visit (INDEPENDENT_AMBULATORY_CARE_PROVIDER_SITE_OTHER): Payer: BC Managed Care – PPO | Admitting: Psychology

## 2020-02-21 DIAGNOSIS — F4323 Adjustment disorder with mixed anxiety and depressed mood: Secondary | ICD-10-CM | POA: Diagnosis not present

## 2020-02-26 ENCOUNTER — Ambulatory Visit (INDEPENDENT_AMBULATORY_CARE_PROVIDER_SITE_OTHER): Payer: BC Managed Care – PPO | Admitting: Psychology

## 2020-02-26 DIAGNOSIS — F4323 Adjustment disorder with mixed anxiety and depressed mood: Secondary | ICD-10-CM | POA: Diagnosis not present

## 2020-03-06 ENCOUNTER — Ambulatory Visit (INDEPENDENT_AMBULATORY_CARE_PROVIDER_SITE_OTHER): Payer: BC Managed Care – PPO | Admitting: Psychology

## 2020-03-06 DIAGNOSIS — F4323 Adjustment disorder with mixed anxiety and depressed mood: Secondary | ICD-10-CM | POA: Diagnosis not present

## 2020-03-14 ENCOUNTER — Ambulatory Visit (INDEPENDENT_AMBULATORY_CARE_PROVIDER_SITE_OTHER): Payer: BC Managed Care – PPO | Admitting: Psychology

## 2020-03-14 DIAGNOSIS — F4323 Adjustment disorder with mixed anxiety and depressed mood: Secondary | ICD-10-CM

## 2020-03-20 ENCOUNTER — Ambulatory Visit (INDEPENDENT_AMBULATORY_CARE_PROVIDER_SITE_OTHER): Payer: BC Managed Care – PPO | Admitting: Psychology

## 2020-03-20 DIAGNOSIS — F4323 Adjustment disorder with mixed anxiety and depressed mood: Secondary | ICD-10-CM

## 2020-03-26 ENCOUNTER — Ambulatory Visit (INDEPENDENT_AMBULATORY_CARE_PROVIDER_SITE_OTHER): Payer: BC Managed Care – PPO | Admitting: Psychology

## 2020-03-26 DIAGNOSIS — F4323 Adjustment disorder with mixed anxiety and depressed mood: Secondary | ICD-10-CM

## 2020-04-03 ENCOUNTER — Ambulatory Visit (INDEPENDENT_AMBULATORY_CARE_PROVIDER_SITE_OTHER): Payer: BC Managed Care – PPO | Admitting: Psychology

## 2020-04-03 DIAGNOSIS — F4323 Adjustment disorder with mixed anxiety and depressed mood: Secondary | ICD-10-CM | POA: Diagnosis not present

## 2020-04-09 ENCOUNTER — Ambulatory Visit (INDEPENDENT_AMBULATORY_CARE_PROVIDER_SITE_OTHER): Payer: BC Managed Care – PPO | Admitting: Psychology

## 2020-04-09 DIAGNOSIS — F4323 Adjustment disorder with mixed anxiety and depressed mood: Secondary | ICD-10-CM | POA: Diagnosis not present

## 2020-04-17 ENCOUNTER — Ambulatory Visit (INDEPENDENT_AMBULATORY_CARE_PROVIDER_SITE_OTHER): Payer: BC Managed Care – PPO | Admitting: Psychology

## 2020-04-17 DIAGNOSIS — F4323 Adjustment disorder with mixed anxiety and depressed mood: Secondary | ICD-10-CM

## 2020-04-23 ENCOUNTER — Ambulatory Visit (INDEPENDENT_AMBULATORY_CARE_PROVIDER_SITE_OTHER): Payer: BC Managed Care – PPO | Admitting: Psychology

## 2020-04-23 DIAGNOSIS — F4323 Adjustment disorder with mixed anxiety and depressed mood: Secondary | ICD-10-CM

## 2020-04-30 ENCOUNTER — Ambulatory Visit (INDEPENDENT_AMBULATORY_CARE_PROVIDER_SITE_OTHER): Payer: BC Managed Care – PPO | Admitting: Psychology

## 2020-04-30 DIAGNOSIS — F4323 Adjustment disorder with mixed anxiety and depressed mood: Secondary | ICD-10-CM

## 2020-05-01 ENCOUNTER — Ambulatory Visit: Payer: BC Managed Care – PPO | Admitting: Psychology

## 2020-05-15 ENCOUNTER — Ambulatory Visit: Payer: BC Managed Care – PPO | Admitting: Psychology

## 2020-06-12 ENCOUNTER — Other Ambulatory Visit: Payer: Self-pay

## 2020-06-12 ENCOUNTER — Ambulatory Visit (INDEPENDENT_AMBULATORY_CARE_PROVIDER_SITE_OTHER): Payer: BC Managed Care – PPO

## 2020-06-12 ENCOUNTER — Ambulatory Visit: Payer: Self-pay

## 2020-06-12 ENCOUNTER — Encounter: Payer: Self-pay | Admitting: Family Medicine

## 2020-06-12 ENCOUNTER — Ambulatory Visit (INDEPENDENT_AMBULATORY_CARE_PROVIDER_SITE_OTHER): Payer: BC Managed Care – PPO | Admitting: Psychology

## 2020-06-12 ENCOUNTER — Ambulatory Visit: Payer: BC Managed Care – PPO | Admitting: Family Medicine

## 2020-06-12 VITALS — BP 116/80 | HR 70 | Ht 67.0 in | Wt 176.0 lb

## 2020-06-12 DIAGNOSIS — S8262XA Displaced fracture of lateral malleolus of left fibula, initial encounter for closed fracture: Secondary | ICD-10-CM | POA: Insufficient documentation

## 2020-06-12 DIAGNOSIS — M25572 Pain in left ankle and joints of left foot: Secondary | ICD-10-CM | POA: Diagnosis not present

## 2020-06-12 DIAGNOSIS — F411 Generalized anxiety disorder: Secondary | ICD-10-CM | POA: Diagnosis not present

## 2020-06-12 DIAGNOSIS — G8929 Other chronic pain: Secondary | ICD-10-CM | POA: Diagnosis not present

## 2020-06-12 DIAGNOSIS — G5603 Carpal tunnel syndrome, bilateral upper limbs: Secondary | ICD-10-CM | POA: Diagnosis not present

## 2020-06-12 DIAGNOSIS — M25532 Pain in left wrist: Secondary | ICD-10-CM

## 2020-06-12 NOTE — Patient Instructions (Addendum)
Good to see you xrays today Exercises don't start until next week New brace today Vitamin d 2000 daily K2 100-200 mcg daily for a month If traveling on a plane take baby asprin 1 day before to 1 day after Ice 20 mins 2 times a day See me again in 4-5 weeks

## 2020-06-12 NOTE — Assessment & Plan Note (Signed)
History of this.  The patient does have some enlargement on the left side.  Discussed the potential for injections or possible surgical intervention.  Patient did have a nerve conduction study in 2018.  Patient will increase activity slowly.  Follow-up with me again in 6 to 8 weeks to see how patient is responding

## 2020-06-12 NOTE — Progress Notes (Signed)
[name] [address] [place] [address] Phone: [phone] Subjective:   I Ronelle Nigh am serving as a Neurosurgeon for Dr. Antoine Primas.  This visit occurred during the SARS-CoV-2 public health emergency.  Safety protocols were in place, including screening questions prior to the visit, additional usage of staff PPE, and extensive cleaning of exam room while observing appropriate contact time as indicated for disinfecting solutions.   I'm seeing this patient by the request  of:  Pincus Sanes, MD  CC: Left ankle and wrist pain  IRS:[id number]  Kathleen Weber is a 60 y.o. female coming in with complaint of L ankle and wrist pain. Patient states she fell twice. Ankle is swollen. Has a history of ankle instability. Pain is better than initial fall. Wants to know what exercises she can do. States the ankle feels weak after she falls. States this morning she had tingling in her fingers but did not know if it was from the fall or carpal tunnel.   Onset- Saturday and Monday night Location - Lateral ankle, wrist  Aggravating factors- pressure on the ankle or wrist  Therapies tried- ankle brace, ice on the ankle Severity- 7/10 at its worse ankle 6/10 wrist      Past Medical History:  Diagnosis Date  . Arthritis   . Chicken pox   . Gastritis   . Hypertension   . Neuropathy   . Thyroid nodule    Past Surgical History:  Procedure Laterality Date  . neg hx     Social History   Socioeconomic History  . Marital status: Married    Spouse name: Not on file  . Number of children: 2  . Years of education: Not on file  . Highest education level: Not on file  Occupational History  . Occupation: Magazine features editor: [employer]  Tobacco Use  . Smoking status: Never Smoker  . Smokeless tobacco: Never Used  Substance and Sexual Activity  . Alcohol use: No  . Drug use: No  . Sexual activity: Not on file  Other Topics Concern   . Not on file  Social History Narrative  . Not on file   Social Determinants of Health   Financial Resource Strain: Not on file  Food Insecurity: Not on file  Transportation Needs: Not on file  Physical Activity: Not on file  Stress: Not on file  Social Connections: Not on file   Allergies  Allergen Reactions  . Pollen Extract Other (See Comments)    Per pt Per pt   . Codeine Other (See Comments)    Hot flash, nausea, feels faint   Family History  Problem Relation Age of Onset  . Stomach cancer Mother   . Heart disease Mother   . Kidney disease Mother        kidney cancer  . Hypertension Mother   . Diabetes Father   . Coronary artery disease Father 22       CABGx5  . Stroke Father        TIAs  . Hypertension Father   . Lymphoma Maternal Grandmother   . Colon cancer Neg Hx      Current Outpatient Medications (Cardiovascular):  .  amLODipine (NORVASC) 5 MG tablet, TAKE 1 TABLET BY MOUTH EVERYDAY AT BEDTIME .  hydrochlorothiazide (MICROZIDE) 12.5 MG capsule, Take 1 capsule (12.5 mg total) by mouth 2 (two) times daily. Marland Kitchen  losartan (COZAAR) 100 MG tablet, Take 1 tablet (  100 mg total) by mouth daily.    Current Outpatient Medications (Hematological):  Marland Kitchen  Ferrous Sulfate (IRON) 325 (65 FE) MG TABS, Take 1 tablet by mouth daily.  Current Outpatient Medications (Other):  .  Biotin 5 MG CAPS, Take by mouth. Take 3 capsule daily .  Probiotic Product (PROBIOTIC DAILY PO), Take by mouth daily.   Reviewed prior external information including notes and imaging from  primary care provider As well as notes that were available from care everywhere and other healthcare systems.  Past medical history, social, surgical and family history all reviewed in electronic medical record.  No pertanent information unless stated regarding to the chief complaint.   Review of Systems:  No headache, visual changes, nausea, vomiting, diarrhea, constipation, dizziness, abdominal pain,  skin rash, fevers, chills, night sweats, weight loss, swollen lymph nodes, body aches, joint swelling, chest pain, shortness of breath, mood changes. POSITIVE muscle aches  Objective  Blood pressure 116/80, pulse 70, height 5\' 7"  (1.702 m), weight 176 lb (79.8 kg), SpO2 97 %.   General: No apparent distress alert and oriented x3 mood and affect normal, dressed appropriately.  HEENT: Pupils equal, extraocular movements intact  Respiratory: Patient's speak in full sentences and does not appear short of breath  Cardiovascular: No lower extremity edema, non tender, no erythema  MSK: Left ankle exam shows the patient does have some tenderness to palpation over the lateral malleolus.  Patient does have swelling noted.  Does have some bruising of the ankle.  Patient is able to ambulate on it but does have some discomfort. Left wrist exam shows the patient is somewhat tight.  Patient though does have good grip strength.  No pain over the distal radius or ulna.  Patient does have a positive Tinel's sign.  Limited musculoskeletal ultrasound was performed and interpreted by  Limited ultrasound of patient's ankle shows the patient does have a cortical irregularity of the lateral malleolus that does appear to be an acute on chronic fracture noted.  Patient has hypoechoic changes as well as some increasing in neovascularization already.  In this vicinity as well does appear the patient does have a partial tear noted of the peroneal tendon.  No retraction noted.  Hypoechoic changes within the tendon sheath also noted.  Regarding patient's left wrist patient does have significant enlargement of the median nerve at 0.24 cm in area.  Patient does not have any cortical irregularity of any of the bones.  He does have some very mild diffuse hypoechoic changes of the tendon sheath that is consistent with a sprain.  Impression: Lateral malleolus fracture with possible peroneal tendon repair  partial Moderate carpal tunnel with wrist splint  Impression and Recommendations:     The above documentation has been reviewed and is accurate and complete Judi Saa, DO

## 2020-06-12 NOTE — Assessment & Plan Note (Signed)
Patient does have what appears to be a lateral malleolus fracture.  Will try an Aircast and see how patient responds.  Patient can wear a heel lift or shoes with more of a wedge when she needs it.  We discussed with placed tendon continue to monitor closely.  Given home exercises to start in 7 to 10 days.  Discussed avoiding certain activities that could cause patient to have a another inversion injury.  Patient is doing relatively well with pain control continues anti-inflammatories as needed.  Discussed the importance of vitamin D supplementation.  Follow-up with me again 4 weeks

## 2020-06-18 ENCOUNTER — Encounter: Payer: Self-pay | Admitting: Family Medicine

## 2020-06-25 ENCOUNTER — Ambulatory Visit: Payer: BC Managed Care – PPO | Admitting: Psychology

## 2020-07-18 ENCOUNTER — Ambulatory Visit: Payer: BC Managed Care – PPO | Admitting: Psychology

## 2020-07-18 NOTE — Progress Notes (Signed)
[name] [address] [place] [address] Phone: [phone] Subjective:   I Ronelle Nigh am serving as a Neurosurgeon for Dr. Antoine Primas.  This visit occurred during the SARS-CoV-2 public health emergency.  Safety protocols were in place, including screening questions prior to the visit, additional usage of staff PPE, and extensive cleaning of exam room while observing appropriate contact time as indicated for disinfecting solutions.   I'm seeing this patient by the request  of:  Pincus Sanes, MD  CC: Ankle fracture follow-up  [id number]  06/12/2020 Patient does have what appears to be a lateral malleolus fracture.  Will try an Aircast and see how patient responds.  Patient can wear a heel lift or shoes with more of a wedge when she needs it.  We discussed with placed tendon continue to monitor closely.  Given home exercises to start in 7 to 10 days.  Discussed avoiding certain activities that could cause patient to have a another inversion injury.  Patient is doing relatively well with pain control continues anti-inflammatories as needed.  Discussed the importance of vitamin D supplementation.  Follow-up with me again 4 weeks  History of this.  The patient does have some enlargement on the left side.  Discussed the potential for injections or possible surgical intervention.  Patient did have a nerve conduction study in 2018.  Patient will increase activity slowly.  Follow-up with me again in 6 to 8 weeks to see how patient is responding   Update 07/19/2020 Kathleen Weber is a 60 y.o. female coming in with complaint of L wrist and L ankle pain.  Patient was found to have a small nondisplaced fracture noted of the lateral malleolus with an old avulsion fracture off the medial malleolus.  Patient did have an ankle joint effusion.  Will give an Aircast and was to increase activity slowly.  Patient states she is better but it still hurts every so often.  Wrist is sensitive. Ankle is painful after walking.       Past Medical History:  Diagnosis Date   Arthritis    Chicken pox    Gastritis    Hypertension    Neuropathy    Thyroid nodule    Past Surgical History:  Procedure Laterality Date   neg hx     Social History   Socioeconomic History   Marital status: Married    Spouse name: Not on file   Number of children: 2   Years of education: Not on file   Highest education level: Not on file  Occupational History   Occupation: Magazine features editor: [employer]  Tobacco Use   Smoking status: Never   Smokeless tobacco: Never  Substance and Sexual Activity   Alcohol use: No   Drug use: No   Sexual activity: Not on file  Other Topics Concern   Not on file  Social History Narrative   Not on file   Social Determinants of Health   Financial Resource Strain: Not on file  Food Insecurity: Not on file  Transportation Needs: Not on file  Physical Activity: Not on file  Stress: Not on file  Social Connections: Not on file   Allergies  Allergen Reactions   Pollen Extract Other (See Comments)    Per pt Per pt    Codeine Other (See Comments)    Hot flash, nausea, feels faint   Family History  Problem Relation Age of Onset  Stomach cancer Mother    Heart disease Mother    Kidney disease Mother        kidney cancer   Hypertension Mother    Diabetes Father    Coronary artery disease Father 74       CABGx5   Stroke Father        TIAs   Hypertension Father    Lymphoma Maternal Grandmother    Colon cancer Neg Hx      Current Outpatient Medications (Cardiovascular):    amLODipine (NORVASC) 5 MG tablet, TAKE 1 TABLET BY MOUTH EVERYDAY AT BEDTIME   hydrochlorothiazide (MICROZIDE) 12.5 MG capsule, Take 1 capsule (12.5 mg total) by mouth 2 (two) times daily.   losartan (COZAAR) 100 MG tablet, Take 1 tablet (100 mg total) by mouth daily.    Current Outpatient Medications (Hematological):     Ferrous Sulfate (IRON) 325 (65 FE) MG TABS, Take 1 tablet by mouth daily.  Current Outpatient Medications (Other):    Biotin 5 MG CAPS, Take by mouth. Take 3 capsule daily   Probiotic Product (PROBIOTIC DAILY PO), Take by mouth daily.   Reviewed prior external information including notes and imaging from  primary care provider As well as notes that were available from care everywhere and other healthcare systems.  Past medical history, social, surgical and family history all reviewed in electronic medical record.  No pertanent information unless stated regarding to the chief complaint.   Review of Systems:  No headache, visual changes, nausea, vomiting, diarrhea, constipation, dizziness, abdominal pain, skin rash, fevers, chills, night sweats, weight loss, swollen lymph nodes, body aches, chest pain, shortness of breath, mood changes. POSITIVE muscle aches, joint swelling  Objective  Blood pressure 118/82, pulse 77, height 5\' 7"  (1.702 m), weight 178 lb (80.7 kg), SpO2 94 %.   General: No apparent distress alert and oriented x3 mood and affect normal, dressed appropriately.  HEENT: Pupils equal, extraocular movements intact  Respiratory: Patient's speak in full sentences and does not appear short of breath  Cardiovascular: No lower extremity edema, non tender, no erythema  Gait normal. Left ankle exam shows the patient does have some very mild tenderness noted over the lateral malleolus still.  Trace swelling noted in that area as well.  Good range of motion of the ankle noted.  Neurovascular intact distally.  Left wrist exam does have some mild thenar eminence wasting noted.  Good grip strength noted.  Mild positive Tinel's bilaterally though.  Limited musculoskeletal ultrasound was performed and interpreted by  Limited ultrasound of patient's left ankle shows the patient does have cortical irregularity still noted at the lateral malleolus.  Patient does have some callus  formation noted but not fully.  Medial tibia does not have any significant abnormality.  Regarding patient's wrist still has significant dilatation and hypoechoic changes noted of the median nerve at the carpal tunnel.  Impression: Interval improvement but not completely healed lateral malleolus fracture and severe carpal tunnel of the left wrist   Impression and Recommendations:     The above documentation has been reviewed and is accurate and complete Judi Saa, DO

## 2020-07-19 ENCOUNTER — Ambulatory Visit: Payer: BC Managed Care – PPO | Admitting: Family Medicine

## 2020-07-19 ENCOUNTER — Other Ambulatory Visit: Payer: Self-pay

## 2020-07-19 ENCOUNTER — Encounter: Payer: Self-pay | Admitting: Family Medicine

## 2020-07-19 ENCOUNTER — Ambulatory Visit: Payer: Self-pay

## 2020-07-19 ENCOUNTER — Ambulatory Visit (INDEPENDENT_AMBULATORY_CARE_PROVIDER_SITE_OTHER): Payer: BC Managed Care – PPO

## 2020-07-19 VITALS — BP 118/82 | HR 77 | Ht 67.0 in | Wt 178.0 lb

## 2020-07-19 DIAGNOSIS — G5603 Carpal tunnel syndrome, bilateral upper limbs: Secondary | ICD-10-CM

## 2020-07-19 DIAGNOSIS — G8929 Other chronic pain: Secondary | ICD-10-CM | POA: Diagnosis not present

## 2020-07-19 DIAGNOSIS — M25572 Pain in left ankle and joints of left foot: Secondary | ICD-10-CM

## 2020-07-19 DIAGNOSIS — S8262XA Displaced fracture of lateral malleolus of left fibula, initial encounter for closed fracture: Secondary | ICD-10-CM

## 2020-07-19 NOTE — Assessment & Plan Note (Signed)
Improvement noted, discussed which activities to do.  We discussed continuing the Aircast for another 2 weeks daily and then transferring into only for exercise.  Patient will get another x-ray today to further evaluate but I do see some bony callus formation noted.  Likely will be full in 2 weeks.  Discussed icing regimen still.  Follow-up again in 4 to 6 weeks

## 2020-07-19 NOTE — Assessment & Plan Note (Signed)
Severe bilaterally.  Patient does have some mild pain medication.  Patient wants to hold on the injection again.  Given home exercises today.  Patient will follow up with me again in 6 weeks.  Encouraged her to consider the possibility of surgical intervention as well

## 2020-07-19 NOTE — Patient Instructions (Addendum)
Good to see you Repeat xray of ankle Continue wearing the splint daily for 2 weeks then only walks and working out for 4 weeks Carpal tunnel exercises If pain comes back in wrist consider surgery or injection See me again in 5-6 weeks

## 2020-07-26 ENCOUNTER — Other Ambulatory Visit: Payer: Self-pay | Admitting: Internal Medicine

## 2020-08-01 ENCOUNTER — Ambulatory Visit: Payer: BC Managed Care – PPO | Admitting: Psychology

## 2020-08-02 ENCOUNTER — Encounter: Payer: Self-pay | Admitting: Family Medicine

## 2020-08-27 NOTE — Progress Notes (Signed)
[facility] [address] [place] [address] Phone: [phone] Subjective:   Kathleen [facility], am serving as a scribe for Dr. Antoine Primas.  This visit occurred during the SARS-CoV-2 public health emergency.  Safety protocols were in place, including screening questions prior to the visit, additional usage of staff PPE, and extensive cleaning of exam room while observing appropriate contact time as indicated for disinfecting solutions.   I'm seeing this patient by the request  of:  Pincus Sanes, MD  CC: Left ankle follow-up  [id number]  07/19/2020 Improvement noted, discussed which activities to do.  We discussed continuing the Aircast for another 2 weeks daily and then transferring into only for exercise.  Patient will get another x-ray today to further evaluate but I do see some bony callus formation noted.  Likely will be full in 2 weeks.  Discussed icing regimen still.  Follow-up again in 4 to 6 weeks  Severe bilaterally.  Patient does have some mild pain medication.  Patient wants to hold on the injection again.  Given home exercises today.  Patient will follow up with me again in 6 weeks.  Encouraged her to consider the possibility of surgical intervention as well  Update 08/28/2020 Kathleen Weber is a 60 y.o. female coming in with complaint of L ankle and B carpal tunnel. Patient states that she has less pain during the day and achiness at night. Overall feels improvement.  Patient states overall feeling 80 to 90% better.  Sometimes feels like she has some weakness but nothing severe.     Past Medical History:  Diagnosis Date   Arthritis    Chicken pox    Gastritis    Hypertension    Neuropathy    Thyroid nodule    Past Surgical History:  Procedure Laterality Date   neg hx     Social History   Socioeconomic History   Marital status: Married    Spouse name: Not on file   Number of children: 2   Years of education: Not on  file   Highest education level: Not on file  Occupational History   Occupation: Magazine features editor: [employer]  Tobacco Use   Smoking status: Never   Smokeless tobacco: Never  Substance and Sexual Activity   Alcohol use: No   Drug use: No   Sexual activity: Not on file  Other Topics Concern   Not on file  Social History Narrative   Not on file   Social Determinants of Health   Financial Resource Strain: Not on file  Food Insecurity: Not on file  Transportation Needs: Not on file  Physical Activity: Not on file  Stress: Not on file  Social Connections: Not on file   Allergies  Allergen Reactions   Pollen Extract Other (See Comments)    Per pt Per pt    Codeine Other (See Comments)    Hot flash, nausea, feels faint   Family History  Problem Relation Age of Onset   Stomach cancer Mother    Heart disease Mother    Kidney disease Mother        kidney cancer   Hypertension Mother    Diabetes Father    Coronary artery disease Father 25       CABGx5   Stroke Father        TIAs   Hypertension Father    Lymphoma Maternal Grandmother    Colon cancer Neg Hx  Current Outpatient Medications (Cardiovascular):    amLODipine (NORVASC) 5 MG tablet, TAKE 1 TABLET BY MOUTH EVERYDAY AT BEDTIME   hydrochlorothiazide (MICROZIDE) 12.5 MG capsule, Take 1 capsule (12.5 mg total) by mouth 2 (two) times daily.   losartan (COZAAR) 100 MG tablet, TAKE 1 TABLET BY MOUTH EVERY DAY    Current Outpatient Medications (Hematological):    Ferrous Sulfate (IRON) 325 (65 FE) MG TABS, Take 1 tablet by mouth daily.  Current Outpatient Medications (Other):    Biotin 5 MG CAPS, Take by mouth. Take 3 capsule daily   Probiotic Product (PROBIOTIC DAILY PO), Take by mouth daily.   Reviewed prior external information including notes and imaging from  primary care provider As well as notes that were available from care everywhere and other healthcare systems.  Past  medical history, social, surgical and family history all reviewed in electronic medical record.  No pertanent information unless stated regarding to the chief complaint.   Review of Systems:  No headache, visual changes, nausea, vomiting, diarrhea, constipation, dizziness, abdominal pain, skin rash, fevers, chills, night sweats, weight loss, swollen lymph nodes, body aches, joint swelling, chest pain, shortness of breath, mood changes. POSITIVE muscle aches  Objective  Blood pressure 110/78, pulse 79, height 5\' 7"  (1.702 m), SpO2 97 %.   General: No apparent distress alert and oriented x3 mood and affect normal, dressed appropriately.  HEENT: Pupils equal, extraocular movements intact  Respiratory: Patient's speak in full sentences and does not appear short of breath  Cardiovascular: No lower extremity edema, non tender, no erythema  Gait normal with good balance and coordination.  MSK: Left ankle exam shows the patient is very minimally tender to the lateral malleolus.  Good range of motion.  No significant instability noted.  Limited musculoskeletal ultrasound was performed and interpreted by  Limited ultrasound of patient's lateral malleolus shows the patient does have good callus formation noted over the malleolus at this point that is approximately 85 to 95% healed.  Seems to be more of a hard callus and soft callus.  Mild hypoechoic changes of the peroneal tendon noted. Impression: Healing lateral malleolus fracture with mild reactive peroneal tendinitis    Impression and Recommendations:     The above documentation has been reviewed and is accurate and complete Judi Saa, DO

## 2020-08-28 ENCOUNTER — Encounter: Payer: Self-pay | Admitting: Family Medicine

## 2020-08-28 ENCOUNTER — Ambulatory Visit (INDEPENDENT_AMBULATORY_CARE_PROVIDER_SITE_OTHER): Payer: BC Managed Care – PPO

## 2020-08-28 ENCOUNTER — Ambulatory Visit: Payer: Self-pay

## 2020-08-28 ENCOUNTER — Ambulatory Visit: Payer: BC Managed Care – PPO | Admitting: Family Medicine

## 2020-08-28 ENCOUNTER — Other Ambulatory Visit: Payer: Self-pay

## 2020-08-28 VITALS — BP 110/78 | HR 79 | Ht 67.0 in

## 2020-08-28 DIAGNOSIS — M25572 Pain in left ankle and joints of left foot: Secondary | ICD-10-CM | POA: Diagnosis not present

## 2020-08-28 DIAGNOSIS — G5603 Carpal tunnel syndrome, bilateral upper limbs: Secondary | ICD-10-CM

## 2020-08-28 DIAGNOSIS — S8262XA Displaced fracture of lateral malleolus of left fibula, initial encounter for closed fracture: Secondary | ICD-10-CM

## 2020-08-28 NOTE — Patient Instructions (Signed)
Good to see you Wear the brace with walking and exercising See me again in 6- 8 weeks if not completely healed

## 2020-08-28 NOTE — Assessment & Plan Note (Signed)
Continue to monitor.  Patient feels like if anything she seems to be doing better with the exercises.  Any worsening pain consider injection Sent for surgical intervention

## 2020-08-28 NOTE — Assessment & Plan Note (Signed)
Patient does have good callus formation noted on x-ray as well as on the ultrasound today.  Patient will continue to wear the Aircast with exercises.  Can stop wearing it on a daily basis though.  Discussed range of motion and strengthening exercises that patient will do to both ankles.  Follow-up with me again in 6 to 8 weeks otherwise.

## 2020-08-29 ENCOUNTER — Ambulatory Visit: Payer: BC Managed Care – PPO | Admitting: Psychology

## 2020-10-01 ENCOUNTER — Ambulatory Visit: Payer: BC Managed Care – PPO | Admitting: Family Medicine

## 2020-10-09 ENCOUNTER — Ambulatory Visit: Payer: BC Managed Care – PPO | Admitting: Family Medicine

## 2020-10-17 NOTE — Progress Notes (Signed)
[facility] [address] [place] [address] Phone: [phone] Subjective:   INadine Counts, am serving as a scribe for Dr. Antoine Primas. This visit occurred during the SARS-CoV-2 public health emergency.  Safety protocols were in place, including screening questions prior to the visit, additional usage of staff PPE, and extensive cleaning of exam room while observing appropriate contact time as indicated for disinfecting solutions.   I'm seeing this patient by the request  of:  Pincus Sanes, MD  CC: Left ankle pain follow-up  [id number]  08/28/2020 Continue to monitor.  Patient feels like if anything she seems to be doing better with the exercises.  Any worsening pain consider injection Sent for surgical intervention  Patient does have good callus formation noted on x-ray as well as on the ultrasound today.  Patient will continue to wear the Aircast with exercises.  Can stop wearing it on a daily basis though.  Discussed range of motion and strengthening exercises that patient will do to both ankles.  Follow-up with me again in 6 to 8 weeks otherwise.  Update 10/18/2020 Kathleen Weber is a 60 y.o. female coming in with complaint of L ankle and B wrist pain. Patient states her pains have been getting progressively better and she has not new complaints. Patient states that she has been making improvement overall.  Some mild discomfort from time to time but nothing severe.  Still has been very cautious in increasing activity.      Past Medical History:  Diagnosis Date   Arthritis    Chicken pox    Gastritis    Hypertension    Neuropathy    Thyroid nodule    Past Surgical History:  Procedure Laterality Date   neg hx     Social History   Socioeconomic History   Marital status: Married    Spouse name: Not on file   Number of children: 2   Years of education: Not on file   Highest education level: Not on file  Occupational History    Occupation: Magazine features editor: [employer]  Tobacco Use   Smoking status: Never   Smokeless tobacco: Never  Substance and Sexual Activity   Alcohol use: No   Drug use: No   Sexual activity: Not on file  Other Topics Concern   Not on file  Social History Narrative   Not on file   Social Determinants of Health   Financial Resource Strain: Not on file  Food Insecurity: Not on file  Transportation Needs: Not on file  Physical Activity: Not on file  Stress: Not on file  Social Connections: Not on file   Allergies  Allergen Reactions   Pollen Extract Other (See Comments)    Per pt Per pt    Codeine Other (See Comments)    Hot flash, nausea, feels faint   Family History  Problem Relation Age of Onset   Stomach cancer Mother    Heart disease Mother    Kidney disease Mother        kidney cancer   Hypertension Mother    Diabetes Father    Coronary artery disease Father 64       CABGx5   Stroke Father        TIAs   Hypertension Father    Lymphoma Maternal Grandmother    Colon cancer Neg Hx      Current Outpatient Medications (Cardiovascular):    amLODipine (NORVASC)  5 MG tablet, TAKE 1 TABLET BY MOUTH EVERYDAY AT BEDTIME   hydrochlorothiazide (MICROZIDE) 12.5 MG capsule, Take 1 capsule (12.5 mg total) by mouth 2 (two) times daily.   losartan (COZAAR) 100 MG tablet, TAKE 1 TABLET BY MOUTH EVERY DAY    Current Outpatient Medications (Hematological):    Ferrous Sulfate (IRON) 325 (65 FE) MG TABS, Take 1 tablet by mouth daily.  Current Outpatient Medications (Other):    Biotin 5 MG CAPS, Take by mouth. Take 3 capsule daily   Probiotic Product (PROBIOTIC DAILY PO), Take by mouth daily.   Reviewed prior external information including notes and imaging from  primary care provider As well as notes that were available from care everywhere and other healthcare systems.  Past medical history, social, surgical and family history all reviewed in  electronic medical record.  No pertanent information unless stated regarding to the chief complaint.   Review of Systems:  No headache, visual changes, nausea, vomiting, diarrhea, constipation, dizziness, abdominal pain, skin rash, fevers, chills, night sweats, weight loss, swollen lymph nodes, body aches, joint swelling, chest pain, shortness of breath, mood changes. POSITIVE muscle aches  Objective  Blood pressure 110/68, pulse 76, height 5\' 7"  (1.702 m), weight 182 lb (82.6 kg), SpO2 94 %.   General: No apparent distress alert and oriented x3 mood and affect normal, dressed appropriately.  HEENT: Pupils equal, extraocular movements intact  Respiratory: Patient's speak in full sentences and does not appear short of breath  Cardiovascular: No lower extremity edema, non tender, no erythema  Gait normal with good balance and coordination.  MSK: Left ankle exam does have trace effusion noted of the ankle on the lateral aspect of the ankle.  Patient still has some pain to palpation more over the peroneal tendons and the lateral malleolus itself.  Very mild tightness still noted on the lateral aspect.  Limited muscular skeletal ultrasound was performed and interpreted by , M  Limited ultrasound of patient's left ankle shows the patient does have hypoechoic changes noted of the peroneal tendon noted mostly around the inferior aspect of the lateral malleolus.  Chronic lateral malleolus fracture still noted with callus formation noted in the area.  Decrease in the hypoechoic changes from previous exam.    Impression and Recommendations:     The above documentation has been reviewed and is accurate and complete Antoine Primas, DO

## 2020-10-18 ENCOUNTER — Other Ambulatory Visit: Payer: Self-pay

## 2020-10-18 ENCOUNTER — Ambulatory Visit: Payer: BC Managed Care – PPO | Admitting: Family Medicine

## 2020-10-18 ENCOUNTER — Encounter: Payer: Self-pay | Admitting: Family Medicine

## 2020-10-18 DIAGNOSIS — S8262XA Displaced fracture of lateral malleolus of left fibula, initial encounter for closed fracture: Secondary | ICD-10-CM | POA: Diagnosis not present

## 2020-10-18 NOTE — Assessment & Plan Note (Addendum)
Patient on ultrasound today seems to be doing relatively well.  Seems to be well-healed overall.  Discussed icing regimen and home exercises, which activities to do and which ones to avoid.  Increase activity slowly.  Patient know as long as no significant pain will be fine.  Patient does have some mild reactive peroneal tendinitis that we will need to continue to monitor.  Worsening pain would need MRI.

## 2020-10-28 ENCOUNTER — Other Ambulatory Visit: Payer: Self-pay | Admitting: Internal Medicine

## 2020-11-06 ENCOUNTER — Telehealth: Payer: Self-pay | Admitting: Internal Medicine

## 2020-11-06 DIAGNOSIS — E782 Mixed hyperlipidemia: Secondary | ICD-10-CM

## 2020-11-06 DIAGNOSIS — I10 Essential (primary) hypertension: Secondary | ICD-10-CM

## 2020-11-06 DIAGNOSIS — Z0000 Encounter for general adult medical examination without abnormal findings: Secondary | ICD-10-CM

## 2020-11-06 DIAGNOSIS — R739 Hyperglycemia, unspecified: Secondary | ICD-10-CM

## 2020-11-06 NOTE — Telephone Encounter (Signed)
    Patient requesting order for annual labs prior to 10/5 appointment

## 2020-11-06 NOTE — Telephone Encounter (Signed)
Ordered for [place] 

## 2020-11-06 NOTE — Telephone Encounter (Signed)
Tried to reach patient but unable to leave voicemail because VM is not set up.  My-chart message sent

## 2020-11-12 ENCOUNTER — Other Ambulatory Visit (INDEPENDENT_AMBULATORY_CARE_PROVIDER_SITE_OTHER): Payer: BC Managed Care – PPO

## 2020-11-12 ENCOUNTER — Other Ambulatory Visit: Payer: Self-pay

## 2020-11-12 DIAGNOSIS — E782 Mixed hyperlipidemia: Secondary | ICD-10-CM

## 2020-11-12 DIAGNOSIS — R739 Hyperglycemia, unspecified: Secondary | ICD-10-CM | POA: Diagnosis not present

## 2020-11-12 DIAGNOSIS — Z0000 Encounter for general adult medical examination without abnormal findings: Secondary | ICD-10-CM

## 2020-11-12 DIAGNOSIS — I10 Essential (primary) hypertension: Secondary | ICD-10-CM | POA: Diagnosis not present

## 2020-11-12 LAB — CBC WITH DIFFERENTIAL/PLATELET
Basophils Absolute: 0 10*3/uL (ref 0.0–0.1)
Basophils Relative: 0.5 % (ref 0.0–3.0)
Eosinophils Absolute: 0.1 10*3/uL (ref 0.0–0.7)
Eosinophils Relative: 1.2 % (ref 0.0–5.0)
HCT: 43.1 % (ref 36.0–46.0)
Hemoglobin: 14.5 g/dL (ref 12.0–15.0)
Lymphocytes Relative: 43 % (ref 12.0–46.0)
Lymphs Abs: 2 10*3/uL (ref 0.7–4.0)
MCHC: 33.6 g/dL (ref 30.0–36.0)
MCV: 85 fl (ref 78.0–100.0)
Monocytes Absolute: 0.5 10*3/uL (ref 0.1–1.0)
Monocytes Relative: 10 % (ref 3.0–12.0)
Neutro Abs: 2.1 10*3/uL (ref 1.4–7.7)
Neutrophils Relative %: 45.3 % (ref 43.0–77.0)
Platelets: 183 10*3/uL (ref 150.0–400.0)
RBC: 5.07 Mil/uL (ref 3.87–5.11)
RDW: 12.6 % (ref 11.5–15.5)
WBC: 4.6 10*3/uL (ref 4.0–10.5)

## 2020-11-12 LAB — HEMOGLOBIN A1C: Hgb A1c MFr Bld: 5.8 % (ref 4.6–6.5)

## 2020-11-12 LAB — TSH: TSH: 0.99 u[IU]/mL (ref 0.35–5.50)

## 2020-11-12 LAB — COMPREHENSIVE METABOLIC PANEL
ALT: 14 U/L (ref 0–35)
AST: 21 U/L (ref 0–37)
Albumin: 4.2 g/dL (ref 3.5–5.2)
Alkaline Phosphatase: 54 U/L (ref 39–117)
BUN: 18 mg/dL (ref 6–23)
CO2: 32 mEq/L (ref 19–32)
Calcium: 9.4 mg/dL (ref 8.4–10.5)
Chloride: 97 mEq/L (ref 96–112)
Creatinine, Ser: 0.73 mg/dL (ref 0.40–1.20)
GFR: 89.1 mL/min (ref >60.00)
Glucose, Bld: 99 mg/dL (ref 70–99)
Potassium: 3.2 mEq/L — ABNORMAL LOW (ref 3.5–5.1)
Sodium: 139 mEq/L (ref 135–145)
Total Bilirubin: 0.8 mg/dL (ref 0.2–1.2)
Total Protein: 7.4 g/dL (ref 6.0–8.3)

## 2020-11-12 LAB — LIPID PANEL
Cholesterol: 198 mg/dL (ref 0–200)
HDL: 47.9 mg/dL (ref >39.00)
LDL Cholesterol: 125 mg/dL — ABNORMAL HIGH (ref 0–99)
NonHDL: 149.66
Total CHOL/HDL Ratio: 4
Triglycerides: 123 mg/dL (ref 0.0–149.0)
VLDL: 24.6 mg/dL (ref 0.0–40.0)

## 2020-11-12 NOTE — Patient Instructions (Addendum)
Blood work was ordered - to be done in about 6 months    Medications changes include :   try decreasing your hctz to once a day.      Please followup in 1 year    Health Maintenance, Female Adopting a healthy lifestyle and getting preventive care are important in promoting health and wellness. Ask your health care provider about: The right schedule for you to have regular tests and exams. Things you can do on your own to prevent diseases and keep yourself healthy. What should I know about diet, weight, and exercise? Eat a healthy diet  Eat a diet that includes plenty of vegetables, fruits, low-fat dairy products, and lean protein. Do not eat a lot of foods that are high in solid fats, added sugars, or sodium. Maintain a healthy weight Body mass index (BMI) is used to identify weight problems. It estimates body fat based on height and weight. Your health care provider can help determine your BMI and help you achieve or maintain a healthy weight. Get regular exercise Get regular exercise. This is one of the most important things you can do for your health. Most adults should: Exercise for at least 150 minutes each week. The exercise should increase your heart rate and make you sweat (moderate-intensity exercise). Do strengthening exercises at least twice a week. This is in addition to the moderate-intensity exercise. Spend less time sitting. Even light physical activity can be beneficial. Watch cholesterol and blood lipids Have your blood tested for lipids and cholesterol at 61 years of age, then have this test every 5 years. Have your cholesterol levels checked more often if: Your lipid or cholesterol levels are high. You are older than 61 years of age. You are at high risk for heart disease. What should I know about cancer screening? Depending on your health history and family history, you may need to have cancer screening at various ages. This may include screening for: Breast  cancer. Cervical cancer. Colorectal cancer. Skin cancer. Lung cancer. What should I know about heart disease, diabetes, and high blood pressure? Blood pressure and heart disease High blood pressure causes heart disease and increases the risk of stroke. This is more likely to develop in people who have high blood pressure readings, are of African descent, or are overweight. Have your blood pressure checked: Every 3-5 years if you are 35-38 years of age. Every year if you are 47 years old or older. Diabetes Have regular diabetes screenings. This checks your fasting blood sugar level. Have the screening done: Once every three years after age 89 if you are at a normal weight and have a low risk for diabetes. More often and at a younger age if you are overweight or have a high risk for diabetes. What should I know about preventing infection? Hepatitis B If you have a higher risk for hepatitis B, you should be screened for this virus. Talk with your health care provider to find out if you are at risk for hepatitis B infection. Hepatitis C Testing is recommended for: Everyone born from [birth year] through 1966. Anyone with known risk factors for hepatitis C. Sexually transmitted infections (STIs) Get screened for STIs, including gonorrhea and chlamydia, if: You are sexually active and are younger than 61 years of age. You are older than 61 years of age and your health care provider tells you that you are at risk for this type of infection. Your sexual activity has changed since you were last screened, and  you are at increased risk for chlamydia or gonorrhea. Ask your health care provider if you are at risk. Ask your health care provider about whether you are at high risk for HIV. Your health care provider may recommend a prescription medicine to help prevent HIV infection. If you choose to take medicine to prevent HIV, you should first get tested for HIV. You should then be tested every 3 months for as  long as you are taking the medicine. Pregnancy If you are about to stop having your period (premenopausal) and you may become pregnant, seek counseling before you get pregnant. Take 400 to 800 micrograms (mcg) of folic acid every day if you become pregnant. Ask for birth control (contraception) if you want to prevent pregnancy. Osteoporosis and menopause Osteoporosis is a disease in which the bones lose minerals and strength with aging. This can result in bone fractures. If you are 67 years old or older, or if you are at risk for osteoporosis and fractures, ask your health care provider if you should: Be screened for bone loss. Take a calcium or vitamin D supplement to lower your risk of fractures. Be given hormone replacement therapy (HRT) to treat symptoms of menopause. Follow these instructions at home: Lifestyle Do not use any products that contain nicotine or tobacco, such as cigarettes, e-cigarettes, and chewing tobacco. If you need help quitting, ask your health care provider. Do not use street drugs. Do not share needles. Ask your health care provider for help if you need support or information about quitting drugs. Alcohol use Do not drink alcohol if: Your health care provider tells you not to drink. You are pregnant, may be pregnant, or are planning to become pregnant. If you drink alcohol: Limit how much you use to 0-1 drink a day. Limit intake if you are breastfeeding. Be aware of how much alcohol is in your drink. In the [place] one drink equals one 12 oz bottle of beer (355 mL), one 5 oz glass of wine (148 mL), or one 1 oz glass of hard liquor (44 mL). General instructions Schedule regular health, dental, and eye exams. Stay current with your vaccines. Tell your health care provider if: You often feel depressed. You have ever been abused or do not feel safe at home. Summary Adopting a healthy lifestyle and getting preventive care are important in promoting health and  wellness. Follow your health care provider's instructions about healthy diet, exercising, and getting tested or screened for diseases. Follow your health care provider's instructions on monitoring your cholesterol and blood pressure. This information is not intended to replace advice given to you by your health care provider. Make sure you discuss any questions you have with your health care provider. Document Revised: 04/05/2020 Document Reviewed: 01/19/2018 Elsevier Patient Education  2022 [employer].

## 2020-11-12 NOTE — Progress Notes (Signed)
M,    Subjective:    Patient ID: Kathleen Weber, female    DOB: 11/28/1960, 60 y.o.   MRN: [id number]   This visit occurred during the SARS-CoV-2 public health emergency.  Safety protocols were in place, including screening questions prior to the visit, additional usage of staff PPE, and extensive cleaning of exam room while observing appropriate contact time as indicated for disinfecting solutions.    HPI She is here for a physical exam.   She denies any changes in her health.  She has no concerns.  Medications and allergies reviewed with patient and updated if appropriate.  Patient Active Problem List   Diagnosis Date Noted   Closed low lateral malleolus fracture, left, initial encounter 06/12/2020   Low iron 11/09/2019   Chronic cough 11/08/2018   Laryngopharyngeal reflux (LPR) 10/04/2018   Family history of diabetes mellitus 10/19/2017   Hyperglycemia 10/01/2016   Carpal tunnel syndrome, bilateral 02/22/2016   IBS (irritable bowel syndrome) 08/05/2015   Colitis 05/15/2015   Hyperlipidemia 05/15/2015   Hypertension 12/19/2012   Allergic rhinitis 12/19/2012   Thyroid nodule    Bilateral hand numbness    Overweight     Current Outpatient Medications on File Prior to Visit  Medication Sig Dispense Refill   amLODipine (NORVASC) 5 MG tablet TAKE 1 TABLET BY MOUTH EVERYDAY AT BEDTIME 90 tablet 3   Biotin 5 MG CAPS Take by mouth. Take 3 capsule daily     Ferrous Sulfate (IRON) 325 (65 FE) MG TABS Take 1 tablet by mouth daily.     losartan (COZAAR) 100 MG tablet TAKE 1 TABLET BY MOUTH EVERY DAY 90 tablet 3   Probiotic Product (PROBIOTIC DAILY PO) Take by mouth daily.     VITAMIN D PO Take 1 capsule by mouth daily.     No current facility-administered medications on file prior to visit.    Past Medical History:  Diagnosis Date   Arthritis    Chicken pox    Gastritis    Hypertension    Neuropathy    Thyroid nodule     Past Surgical History:  Procedure  Laterality Date   neg hx      Social History   Socioeconomic History   Marital status: Married    Spouse name: Not on file   Number of children: 2   Years of education: Not on file   Highest education level: Not on file  Occupational History   Occupation: Magazine features editor: [employer]  Tobacco Use   Smoking status: Never   Smokeless tobacco: Never  Substance and Sexual Activity   Alcohol use: No   Drug use: No   Sexual activity: Not on file  Other Topics Concern   Not on file  Social History Narrative   Not on file   Social Determinants of Health   Financial Resource Strain: Not on file  Food Insecurity: Not on file  Transportation Needs: Not on file  Physical Activity: Not on file  Stress: Not on file  Social Connections: Not on file    Family History  Problem Relation Age of Onset   Stomach cancer Mother    Heart disease Mother    Kidney disease Mother        kidney cancer   Hypertension Mother    Diabetes Father    Coronary artery disease Father 74       CABGx5   Stroke Father  TIAs   Hypertension Father    Lymphoma Maternal Grandmother    Colon cancer Neg Hx     Review of Systems  Constitutional:  Negative for chills and fever.  Eyes:  Negative for visual disturbance.  Respiratory:  Negative for cough, shortness of breath and wheezing.   Cardiovascular:  Negative for chest pain, palpitations and leg swelling.  Gastrointestinal:  Negative for abdominal pain, blood in stool, constipation, diarrhea and nausea.       No gerd  Genitourinary:  Negative for dysuria and hematuria.  Musculoskeletal:  Negative for arthralgias and back pain.  Skin:  Negative for color change and rash.  Neurological:  Negative for light-headedness and headaches.  Psychiatric/Behavioral:  Negative for dysphoric mood. The patient is not nervous/anxious.       Objective:   Vitals:   11/13/20 0810  BP: 102/68  Pulse: 87  Temp: 98.6 F (37 C)   SpO2: 95%   Filed Weights   11/13/20 0810  Weight: 182 lb (82.6 kg)   Body mass index is 28.51 kg/m.  BP Readings from Last 3 Encounters:  11/13/20 102/68  10/18/20 110/68  08/28/20 110/78    Wt Readings from Last 3 Encounters:  11/13/20 182 lb (82.6 kg)  10/18/20 182 lb (82.6 kg)  07/19/20 178 lb (80.7 kg)    Depression screen [facility] 2/9 11/13/2020 11/09/2019 10/22/2017  Decreased Interest 0 0 0  Down, Depressed, Hopeless 0 0 0  PHQ - 2 Score 0 0 0  Altered sleeping 0 - 0  Tired, decreased energy 0 - 0  Change in appetite 0 - 0  Feeling bad or failure about yourself  0 - 0  Trouble concentrating 0 - 0  Moving slowly or fidgety/restless 0 - 0  Suicidal thoughts 0 - 0  PHQ-9 Score 0 - 0    GAD 7 : Generalized Anxiety Score 11/13/2020  Nervous, Anxious, on Edge 0  Control/stop worrying 0  Worry too much - different things 0  Trouble relaxing 0  Restless 0  Easily annoyed or irritable 0  Afraid - awful might happen 0  Total GAD 7 Score 0       Physical Exam Constitutional: She appears well-developed and well-nourished. No distress.  HENT:  Head: Normocephalic and atraumatic.  Right Ear: External ear normal. Normal ear canal and TM Left Ear: External ear normal.  Normal ear canal and TM Mouth/Throat: Oropharynx is clear and moist.  Eyes: Conjunctivae and EOM are normal.  Neck: Neck supple. No tracheal deviation present. No thyromegaly present.  No carotid bruit  Cardiovascular: Normal rate, regular rhythm and normal heart sounds.   No murmur heard.  No edema. Pulmonary/Chest: Effort normal and breath sounds normal. No respiratory distress. She has no wheezes. She has no rales.  Breast: deferred   Abdominal: Soft. She exhibits no distension. There is no tenderness.  Lymphadenopathy: She has no cervical adenopathy.  Skin: Skin is warm and dry. She is not diaphoretic.  Psychiatric: She has a normal mood and affect. Her behavior is normal.     Lab Results   Component Value Date   WBC 4.6 11/12/2020   HGB 14.5 11/12/2020   HCT 43.1 11/12/2020   PLT 183.0 11/12/2020   GLUCOSE 99 11/12/2020   CHOL 198 11/12/2020   TRIG 123.0 11/12/2020   HDL 47.90 11/12/2020   LDLCALC 125 (H) 11/12/2020   ALT 14 11/12/2020   AST 21 11/12/2020   NA 139 11/12/2020   K 3.2 (L)  11/12/2020   CL 97 11/12/2020   CREATININE 0.73 11/12/2020   BUN 18 11/12/2020   CO2 32 11/12/2020   TSH 0.99 11/12/2020   HGBA1C 5.8 11/12/2020    The 10-year ASCVD risk score (Arnett DK, et al., 2019) is: 6.4%   Values used to calculate the score:     Age: 24 years     Sex: Female     Is Non-Hispanic African American: No     Diabetic: No     Tobacco smoker: Yes     Systolic Blood Pressure: 102 mmHg     Is BP treated: Yes     HDL Cholesterol: 47.9 mg/dL     Total Cholesterol: 198 mg/dL      Assessment & Plan:   Physical exam: Screening blood work  ordered Exercise  regular- doing weights with trainer, just restarted cardio-was not able to do it for a while because of her ankle fracture Weight  overweight - working on weight Substance abuse  none  Screened for depression using the PHQ 9 scale.  No evidence of depression.   Screened for anxiety using GAD7 Scale.  No evidence of anxiety.    Reviewed recommended immunizations.   Health Maintenance  Topic Date Due   PAP SMEAR-Modifier  11/06/2017   MAMMOGRAM  01/08/2019   COVID-19 Vaccine (3 - Booster for Moderna series) 10/15/2019   INFLUENZA VACCINE  05/09/2021 (Originally 09/09/2020)   COLONOSCOPY (Pts 45-28yrs Insurance coverage will need to be confirmed)  08/04/2022   TETANUS/TDAP  11/07/2028   Hepatitis C Screening  Completed   HIV Screening  Completed   Zoster Vaccines- Shingrix  Completed   HPV VACCINES  Aged Out      Mammogram and Pap smear are up-to-date-we will get report from physician for women    See Problem List for Assessment and Plan of chronic medical problems.

## 2020-11-13 ENCOUNTER — Encounter: Payer: Self-pay | Admitting: Internal Medicine

## 2020-11-13 ENCOUNTER — Ambulatory Visit (INDEPENDENT_AMBULATORY_CARE_PROVIDER_SITE_OTHER): Payer: BC Managed Care – PPO | Admitting: Internal Medicine

## 2020-11-13 VITALS — BP 102/68 | HR 87 | Temp 98.6°F | Ht 67.0 in | Wt 182.0 lb

## 2020-11-13 DIAGNOSIS — E782 Mixed hyperlipidemia: Secondary | ICD-10-CM

## 2020-11-13 DIAGNOSIS — I10 Essential (primary) hypertension: Secondary | ICD-10-CM

## 2020-11-13 DIAGNOSIS — E611 Iron deficiency: Secondary | ICD-10-CM

## 2020-11-13 DIAGNOSIS — R739 Hyperglycemia, unspecified: Secondary | ICD-10-CM

## 2020-11-13 DIAGNOSIS — Z0000 Encounter for general adult medical examination without abnormal findings: Secondary | ICD-10-CM | POA: Diagnosis not present

## 2020-11-13 DIAGNOSIS — Z1331 Encounter for screening for depression: Secondary | ICD-10-CM

## 2020-11-13 MED ORDER — HYDROCHLOROTHIAZIDE 12.5 MG PO CAPS: 12.5000 mg | ORAL_CAPSULE | Freq: Every day | ORAL | 3 refills | Status: DC

## 2020-11-13 NOTE — Assessment & Plan Note (Addendum)
Neck Likely dietary due to being a vegetarian Taking iron supplementation a few times a week Check iron panel in 6 months

## 2020-11-13 NOTE — Assessment & Plan Note (Addendum)
Chronic Blood pressure well controlled CMP Continue amlodipine 5 mg daily, losartan 100 mg Try decreasing hctz 12.5 mg once a day due ot low K Continue to monitor BP at home-monitor more regularly since making a change in her medication

## 2020-11-13 NOTE — Assessment & Plan Note (Addendum)
Chronic Regular exercise and healthy diet encouraged Diet controlled Low ASCVD risk low ASCVD risk so does not need a statin at this time He would like to work on lifestyle to help improve her cholesterol Will recheck lipid panel in 6 months

## 2020-11-13 NOTE — Assessment & Plan Note (Addendum)
Chronic A1c in prediabetic range-5.8% Low sugar / carb diet Stressed regular exercise Working on weight loss  Will recheck A1c in about 6 months

## 2020-11-21 NOTE — Progress Notes (Deleted)
[facility] [address] [place] [address] Phone: [phone] Subjective:    I'm seeing this patient by the request  of:  Pincus Sanes, MD  CC:   [id number]  10/18/2020 Patient on ultrasound today seems to be doing relatively well.  Seems to be well-healed overall.  Discussed icing regimen and home exercises, which activities to do and which ones to avoid.  Increase activity slowly.  Patient know as long as no significant pain will be fine.  Patient does have some mild reactive peroneal tendinitis that we will need to continue to monitor.   Worsening pain would need MRI.  Updated 11/26/2020 Laurie Penado is a 60 y.o. female coming in with complaint of left wrist and ankle pain.  Onset-  Location Duration-  Character- Aggravating factors- Reliving factors-  Therapies tried-  Severity-     Past Medical History:  Diagnosis Date   Arthritis    Chicken pox    Gastritis    Hypertension    Neuropathy    Thyroid nodule    Past Surgical History:  Procedure Laterality Date   neg hx     Social History   Socioeconomic History   Marital status: Married    Spouse name: Not on file   Number of children: 2   Years of education: Not on file   Highest education level: Not on file  Occupational History   Occupation: Magazine features editor: [employer]  Tobacco Use   Smoking status: Never   Smokeless tobacco: Never  Substance and Sexual Activity   Alcohol use: No   Drug use: No   Sexual activity: Not on file  Other Topics Concern   Not on file  Social History Narrative   Not on file   Social Determinants of Health   Financial Resource Strain: Not on file  Food Insecurity: Not on file  Transportation Needs: Not on file  Physical Activity: Not on file  Stress: Not on file  Social Connections: Not on file   Allergies  Allergen Reactions   Pollen Extract Other (See Comments)    Per pt Per pt     Codeine Other (See Comments)    Hot flash, nausea, feels faint   Family History  Problem Relation Age of Onset   Stomach cancer Mother    Heart disease Mother    Kidney disease Mother        kidney cancer   Hypertension Mother    Diabetes Father    Coronary artery disease Father 43       CABGx5   Stroke Father        TIAs   Hypertension Father    Lymphoma Maternal Grandmother    Colon cancer Neg Hx      Current Outpatient Medications (Cardiovascular):    amLODipine (NORVASC) 5 MG tablet, TAKE 1 TABLET BY MOUTH EVERYDAY AT BEDTIME   hydrochlorothiazide (MICROZIDE) 12.5 MG capsule, Take 1 capsule (12.5 mg total) by mouth daily.   losartan (COZAAR) 100 MG tablet, TAKE 1 TABLET BY MOUTH EVERY DAY    Current Outpatient Medications (Hematological):    Ferrous Sulfate (IRON) 325 (65 FE) MG TABS, Take 1 tablet by mouth daily.  Current Outpatient Medications (Other):    Biotin 5 MG CAPS, Take by mouth. Take 3 capsule daily   Probiotic Product (PROBIOTIC DAILY PO), Take by mouth daily.   VITAMIN D PO, Take 1 capsule by mouth daily.  Reviewed prior external information including notes and imaging from  primary care provider As well as notes that were available from care everywhere and other healthcare systems.  Past medical history, social, surgical and family history all reviewed in electronic medical record.  No pertanent information unless stated regarding to the chief complaint.   Review of Systems:  No headache, visual changes, nausea, vomiting, diarrhea, constipation, dizziness, abdominal pain, skin rash, fevers, chills, night sweats, weight loss, swollen lymph nodes, body aches, joint swelling, chest pain, shortness of breath, mood changes. POSITIVE muscle aches  Objective  There were no vitals taken for this visit.   General: No apparent distress alert and oriented x3 mood and affect normal, dressed appropriately.  HEENT: Pupils equal, extraocular movements intact   Respiratory: Patient's speak in full sentences and does not appear short of breath  Cardiovascular: No lower extremity edema, non tender, no erythema  Gait normal with good balance and coordination.  MSK:  Non tender with full range of motion and good stability and symmetric strength and tone of shoulders, elbows, wrist, hip, knee and ankles bilaterally.     Impression and Recommendations:     The above documentation has been reviewed and is accurate and complete Doristine Bosworth

## 2020-11-26 ENCOUNTER — Ambulatory Visit: Payer: BC Managed Care – PPO | Admitting: Family Medicine

## 2020-12-21 ENCOUNTER — Encounter: Payer: Self-pay | Admitting: Internal Medicine

## 2020-12-27 NOTE — Progress Notes (Signed)
[name] [address] [place] [address] Phone: [phone] Subjective:   Kathleen Weber, am serving as a scribe for Dr. Antoine Primas.  This visit occurred during the SARS-CoV-2 public health emergency.  Safety protocols were in place, including screening questions prior to the visit, additional usage of staff PPE, and extensive cleaning of exam room while observing appropriate contact time as indicated for disinfecting solutions.   I'm seeing this patient by the request  of:  Pincus Sanes, MD  CC: Ankle pain and carpal tunnel follow-up  [id number]  10/18/2020 Patient on ultrasound today seems to be doing relatively well.  Seems to be well-healed overall.  Discussed icing regimen and home exercises, which activities to do and which ones to avoid.  Increase activity slowly.  Patient know as long as no significant pain will be fine.  Patient does have some mild reactive peroneal tendinitis that we will need to continue to monitor.   Worsening pain would need MRI.  Updated 12/30/2020 Kathleen Weber is a 60 y.o. female coming in with complaint of left wrist and L ankle pain Patient states that she is doing well.  Patient is not having any significant pain.  Has gone up and down stairs 1 time that did give her some discomfort.  Continues to wear the Aircast with certain activities but overall has been doing better.    Past Medical History:  Diagnosis Date   Arthritis    Chicken pox    Gastritis    Hypertension    Neuropathy    Thyroid nodule    Past Surgical History:  Procedure Laterality Date   neg hx     Social History   Socioeconomic History   Marital status: Married    Spouse name: Not on file   Number of children: 2   Years of education: Not on file   Highest education level: Not on file  Occupational History   Occupation: Magazine features editor: [employer]  Tobacco Use   Smoking status: Never    Smokeless tobacco: Never  Substance and Sexual Activity   Alcohol use: No   Drug use: No   Sexual activity: Not on file  Other Topics Concern   Not on file  Social History Narrative   Not on file   Social Determinants of Health   Financial Resource Strain: Not on file  Food Insecurity: Not on file  Transportation Needs: Not on file  Physical Activity: Not on file  Stress: Not on file  Social Connections: Not on file   Allergies  Allergen Reactions   Pollen Extract Other (See Comments)    Per pt Per pt    Codeine Other (See Comments)    Hot flash, nausea, feels faint   Family History  Problem Relation Age of Onset   Stomach cancer Mother    Heart disease Mother    Kidney disease Mother        kidney cancer   Hypertension Mother    Diabetes Father    Coronary artery disease Father 33       CABGx5   Stroke Father        TIAs   Hypertension Father    Lymphoma Maternal Grandmother    Colon cancer Neg Hx      Current Outpatient Medications (Cardiovascular):    amLODipine (NORVASC) 5 MG tablet, TAKE 1 TABLET BY MOUTH EVERYDAY AT BEDTIME   hydrochlorothiazide (MICROZIDE) 12.5 MG  capsule, Take 1 capsule (12.5 mg total) by mouth daily.   losartan (COZAAR) 100 MG tablet, TAKE 1 TABLET BY MOUTH EVERY DAY    Current Outpatient Medications (Hematological):    Ferrous Sulfate (IRON) 325 (65 FE) MG TABS, Take 1 tablet by mouth daily.  Current Outpatient Medications (Other):    Biotin 5 MG CAPS, Take by mouth. Take 3 capsule daily   Probiotic Product (PROBIOTIC DAILY PO), Take by mouth daily.   VITAMIN D PO, Take 1 capsule by mouth daily.    Review of Systems:  No headache, visual changes, nausea, vomiting, diarrhea, constipation, dizziness, abdominal pain, skin rash, fevers, chills, night sweats, weight loss, swollen lymph nodes, body aches, joint swelling, chest pain, shortness of breath, mood changes. POSITIVE muscle aches  Objective  Blood pressure 110/74, pulse  83, height 5\' 7"  (1.702 m), weight 183 lb (83 kg), SpO2 94 %.   General: No apparent distress alert and oriented x3 mood and affect normal, dressed appropriately.  HEENT: Pupils equal, extraocular movements intact  Respiratory: Patient's speak in full sentences and does not appear short of breath  Cardiovascular: No lower extremity edema, non tender, no erythema  Gait normal with good balance and coordination.  MSK: Left ankle exam shows just a trace effusion noted around the lateral malleolus.  Nontender on exam though.  5 out of 5 strength noted.  Some mild tightness of the posterior cord but otherwise unremarkable.  Limited muscular skeletal ultrasound was performed and interpreted by , M  Limited ultrasound of patient's lateral malleolus still has an area where there is a very small cortical irregularity that could be contributing to some of patient's discomfort.  Mild increase in Doppler flow proximal to this area.  Significant more hard callus formation than previous exam  Impression: Interval improvement again with hard callus formation noted    Impression and Recommendations:    The above documentation has been reviewed and is accurate and complete Antoine Primas, DO

## 2020-12-30 ENCOUNTER — Ambulatory Visit: Payer: BC Managed Care – PPO | Admitting: Family Medicine

## 2020-12-30 ENCOUNTER — Encounter: Payer: Self-pay | Admitting: Family Medicine

## 2020-12-30 ENCOUNTER — Ambulatory Visit: Payer: Self-pay

## 2020-12-30 ENCOUNTER — Other Ambulatory Visit: Payer: Self-pay

## 2020-12-30 VITALS — BP 110/74 | HR 83 | Ht 67.0 in | Wt 183.0 lb

## 2020-12-30 DIAGNOSIS — S8262XA Displaced fracture of lateral malleolus of left fibula, initial encounter for closed fracture: Secondary | ICD-10-CM

## 2020-12-30 DIAGNOSIS — M25532 Pain in left wrist: Secondary | ICD-10-CM

## 2020-12-30 NOTE — Assessment & Plan Note (Signed)
Patient is doing relatively well overall but still has some mild cortical irregularity noted of the lateral malleolus.  Discussed icing regimen and home exercises.  Discussed wearing the brace only for certain activities as well as the vitamin D supplementation.  Increase activity slowly.  Follow-up with me again in 6 to 8 weeks if not completely healed.

## 2020-12-30 NOTE — Patient Instructions (Signed)
2000IU daily Aircast  Send message at beginning of year See me when you need me

## 2021-01-04 ENCOUNTER — Encounter: Payer: Self-pay | Admitting: Internal Medicine

## 2021-01-14 ENCOUNTER — Encounter: Payer: Self-pay | Admitting: Internal Medicine

## 2021-02-13 NOTE — Progress Notes (Signed)
Subjective:    Patient ID: Kathleen Weber, female    DOB: 04/02/1960, 61 y.o.   MRN: [id number]  This visit occurred during the SARS-CoV-2 public health emergency.  Safety protocols were in place, including screening questions prior to the visit, additional usage of staff PPE, and extensive cleaning of exam room while observing appropriate contact time as indicated for disinfecting solutions.    HPI The patient is here for an acute visit.   Anxiety - she is worrying a lot and waking up at night a lot.  She has difficulty falling back asleep.  She is tired.  She is also more irritable.    She denies any depression.   She has been trying to deal with this but is still very symptomatic and thinks she needs to consider medication.  A friend told her about xanax.  She has never taken anything in the past.      Medications and allergies reviewed with patient and updated if appropriate.  Patient Active Problem List   Diagnosis Date Noted   Closed low lateral malleolus fracture, left, initial encounter 06/12/2020   Low iron 11/09/2019   Chronic cough 11/08/2018   Laryngopharyngeal reflux (LPR) 10/04/2018   Family history of diabetes mellitus 10/19/2017   Hyperglycemia 10/01/2016   Carpal tunnel syndrome, bilateral 02/22/2016   IBS (irritable bowel syndrome) 08/05/2015   Colitis 05/15/2015   Hyperlipidemia 05/15/2015   Hypertension 12/19/2012   Allergic rhinitis 12/19/2012   Thyroid nodule    Bilateral hand numbness    Overweight     Current Outpatient Medications on File Prior to Visit  Medication Sig Dispense Refill   amLODipine (NORVASC) 5 MG tablet TAKE 1 TABLET BY MOUTH EVERYDAY AT BEDTIME 90 tablet 3   Biotin 5 MG CAPS Take by mouth. Take 3 capsule daily     Ferrous Sulfate (IRON) 325 (65 FE) MG TABS Take 1 tablet by mouth daily.     losartan (COZAAR) 100 MG tablet TAKE 1 TABLET BY MOUTH EVERY DAY 90 tablet 3   Probiotic Product (PROBIOTIC DAILY PO) Take by mouth  daily.     VITAMIN D PO Take 1 capsule by mouth daily.     No current facility-administered medications on file prior to visit.    Past Medical History:  Diagnosis Date   Arthritis    Chicken pox    Gastritis    Hypertension    Neuropathy    Thyroid nodule     Past Surgical History:  Procedure Laterality Date   neg hx      Social History   Socioeconomic History   Marital status: Married    Spouse name: Not on file   Number of children: 2   Years of education: Not on file   Highest education level: Not on file  Occupational History   Occupation: Product manager: [employer]  Tobacco Use   Smoking status: Never   Smokeless tobacco: Never  Substance and Sexual Activity   Alcohol use: No   Drug use: No   Sexual activity: Not on file  Other Topics Concern   Not on file  Social History Narrative   Not on file   Social Determinants of Health   Financial Resource Strain: Not on file  Food Insecurity: Not on file  Transportation Needs: Not on file  Physical Activity: Not on file  Stress: Not on file  Social Connections: Not on file    Family History  Problem Relation Age of Onset   Stomach cancer Mother    Heart disease Mother    Kidney disease Mother        kidney cancer   Hypertension Mother    Diabetes Father    Coronary artery disease Father 6       CABGx5   Stroke Father        TIAs   Hypertension Father    Lymphoma Maternal Grandmother    Colon cancer Neg Hx     Review of Systems     Objective:   Vitals:   02/14/21 1312  BP: 116/77  Pulse: 67  Temp: 98.1 F (36.7 C)  SpO2: 94%   BP Readings from Last 3 Encounters:  02/14/21 116/77  12/30/20 110/74  11/13/20 102/68   Wt Readings from Last 3 Encounters:  02/14/21 184 lb (83.5 kg)  12/30/20 183 lb (83 kg)  11/13/20 182 lb (82.6 kg)   Body mass index is 28.82 kg/m.   Physical Exam Constitutional:      General: She is not in acute distress.     Appearance: Normal appearance. She is not ill-appearing.  HENT:     Head: Normocephalic and atraumatic.  Skin:    General: Skin is warm and dry.  Neurological:     Mental Status: She is alert.  Psychiatric:        Mood and Affect: Mood normal.        Behavior: Behavior normal.        Thought Content: Thought content normal.        Judgment: Judgment normal.           Assessment & Plan:    See Problem List for Assessment and Plan of chronic medical problems.

## 2021-02-14 ENCOUNTER — Ambulatory Visit: Payer: BC Managed Care – PPO | Admitting: Internal Medicine

## 2021-02-14 ENCOUNTER — Encounter: Payer: Self-pay | Admitting: Internal Medicine

## 2021-02-14 ENCOUNTER — Other Ambulatory Visit: Payer: Self-pay

## 2021-02-14 DIAGNOSIS — I10 Essential (primary) hypertension: Secondary | ICD-10-CM | POA: Diagnosis not present

## 2021-02-14 DIAGNOSIS — G5603 Carpal tunnel syndrome, bilateral upper limbs: Secondary | ICD-10-CM | POA: Diagnosis not present

## 2021-02-14 DIAGNOSIS — F419 Anxiety disorder, unspecified: Secondary | ICD-10-CM | POA: Diagnosis not present

## 2021-02-14 DIAGNOSIS — J309 Allergic rhinitis, unspecified: Secondary | ICD-10-CM | POA: Diagnosis not present

## 2021-02-14 MED ORDER — FLUTICASONE PROPIONATE 50 MCG/ACT NA SUSP: 1.0000 | Freq: Every day | NASAL | 5 refills | Status: DC

## 2021-02-14 MED ORDER — TRAZODONE HCL 50 MG PO TABS: 50.0000 mg | ORAL_TABLET | Freq: Every evening | ORAL | 5 refills | Status: DC | PRN

## 2021-02-14 MED ORDER — BUSPIRONE HCL 5 MG PO TABS: 5.0000 mg | ORAL_TABLET | Freq: Three times a day (TID) | ORAL | 2 refills | Status: DC | PRN

## 2021-02-14 NOTE — Assessment & Plan Note (Signed)
New Having increased anxiety associated with worrying, difficulty sleeping and increased irritability Discussed medication options Advised increased exercise Discussed possible therapy Discussed natural supplements Will try trazodone 50 mg HS prn and/ or buspar 5 mg tid prn -- she will only try one at a time and see which one works best She will update via Clinical cytogeneticist

## 2021-02-14 NOTE — Assessment & Plan Note (Signed)
Chronic Has been wearing braces at night Has seen Dr Nickola Major in the past and would like to see her in the past - will refer

## 2021-02-14 NOTE — Patient Instructions (Addendum)
° ° °  Medications changes :   trazodone (sleep) 50 mg at bedtime as needed       Buspirone (anxiety) 5 mg three times a day as needed   Your prescription(s) have been submitted to your pharmacy. Please take as directed and contact our office if you believe you are having problem(s) with the medication(s).    A referral was ordered for Dr Nickola Major carpal tunnel syndrome.

## 2021-02-14 NOTE — Assessment & Plan Note (Signed)
Chronic BP well controlled off hctz Continue norvasc 5 mg daily, losartan 100 mg daily

## 2021-03-08 ENCOUNTER — Other Ambulatory Visit: Payer: Self-pay | Admitting: Internal Medicine

## 2021-03-26 ENCOUNTER — Encounter: Payer: Self-pay | Admitting: Internal Medicine

## 2021-04-07 LAB — HM MAMMOGRAPHY

## 2021-05-13 ENCOUNTER — Other Ambulatory Visit: Payer: BC Managed Care – PPO

## 2021-05-15 ENCOUNTER — Other Ambulatory Visit (INDEPENDENT_AMBULATORY_CARE_PROVIDER_SITE_OTHER): Payer: BC Managed Care – PPO

## 2021-05-15 DIAGNOSIS — E782 Mixed hyperlipidemia: Secondary | ICD-10-CM | POA: Diagnosis not present

## 2021-05-15 DIAGNOSIS — I10 Essential (primary) hypertension: Secondary | ICD-10-CM | POA: Diagnosis not present

## 2021-05-15 DIAGNOSIS — R739 Hyperglycemia, unspecified: Secondary | ICD-10-CM | POA: Diagnosis not present

## 2021-05-15 DIAGNOSIS — E611 Iron deficiency: Secondary | ICD-10-CM

## 2021-05-15 LAB — LIPID PANEL
Cholesterol: 189 mg/dL (ref 0–200)
HDL: 48.6 mg/dL (ref >39.00)
LDL Cholesterol: 124 mg/dL — ABNORMAL HIGH (ref 0–99)
NonHDL: 140.02
Total CHOL/HDL Ratio: 4
Triglycerides: 82 mg/dL (ref 0.0–149.0)
VLDL: 16.4 mg/dL (ref 0.0–40.0)

## 2021-05-15 LAB — BASIC METABOLIC PANEL
BUN: 15 mg/dL (ref 6–23)
CO2: 31 mEq/L (ref 19–32)
Calcium: 9.6 mg/dL (ref 8.4–10.5)
Chloride: 101 mEq/L (ref 96–112)
Creatinine, Ser: 0.79 mg/dL (ref 0.40–1.20)
GFR: 80.76 mL/min (ref >60.00)
Glucose, Bld: 96 mg/dL (ref 70–99)
Potassium: 3.7 mEq/L (ref 3.5–5.1)
Sodium: 140 mEq/L (ref 135–145)

## 2021-05-15 LAB — HEMOGLOBIN A1C: Hgb A1c MFr Bld: 6 % (ref 4.6–6.5)

## 2021-05-16 ENCOUNTER — Encounter: Payer: Self-pay | Admitting: Internal Medicine

## 2021-05-16 LAB — IRON,TIBC AND FERRITIN PANEL
%SAT: 26 % (calc) (ref 16–45)
Ferritin: 312 ng/mL — ABNORMAL HIGH (ref 16–288)
Iron: 90 ug/dL (ref 45–160)
TIBC: 347 mcg/dL (calc) (ref 250–450)

## 2021-05-22 ENCOUNTER — Other Ambulatory Visit: Payer: BC Managed Care – PPO

## 2021-06-27 ENCOUNTER — Encounter: Payer: Self-pay | Admitting: Family Medicine

## 2021-07-03 ENCOUNTER — Encounter: Payer: Self-pay | Admitting: Internal Medicine

## 2021-07-05 MED ORDER — LOSARTAN POTASSIUM 100 MG PO TABS: 100.0000 mg | ORAL_TABLET | Freq: Every day | ORAL | 0 refills | Status: DC

## 2021-07-13 NOTE — Progress Notes (Unsigned)
  [facility] [place] Phone: [phone] Subjective:   Fontaine No, am serving as a scribe for Dr. Hulan Saas.   I'm seeing this patient by the request  of:  Binnie Rail, MD  CC: Right-sided rib pain  [facility] [place] Phone: [phone] Subjective:    I'm seeing this patient by the request  of:  Binnie Rail, MD  CC: right rib pain   [id number]  Kathleen Weber is a 61 y.o. female coming in with complaint of rib pain. Patient states that she was choking on a pill and was given Heimlich twice 10 days ago. Pain in both sides of the ribs but R>L. Pain also in abdomen with legs flexed so she has to recline seat when riding in car due to pain.        Past Medical History:  Diagnosis Date   Arthritis    Chicken pox    Gastritis    Hypertension    Neuropathy    Thyroid nodule     Allergies  Allergen Reactions   Pollen Extract Other (See Comments)    Per pt Per pt    Codeine Other (See Comments)    Hot flash, nausea, feels faint     Review of Systems:  No headache, visual changes, nausea, vomiting, diarrhea, constipation, dizziness, abdominal pain, skin rash, fevers, chills, night sweats, weight loss, swollen lymph nodes, body aches, joint swelling, chest pain, shortness of breath, mood changes. POSITIVE muscle aches  Objective  Blood pressure 110/82, pulse 69, height 5\' 7"  (1.702 m), weight 181 lb (82.1 kg), SpO2 97 %.   General: No apparent distress alert and oriented x3 mood and affect normal, dressed appropriately.  HEENT: Pupils equal, extraocular movements intact  Respiratory: Patient's speak in full sentences and does not appear short of breath  Cardiovascular: No lower extremity edema, non tender, no erythema  Gait normal with good balance and coordination.  MSK:  Non tender with full range of motion and good stability  and symmetric strength and tone of shoulders, elbows, wrist, hip, knee and ankles bilaterally.  Back - mild loss of lordosis  Pain over the right 10th rib anterior. No crepitus, pain though, no abdominal pain       Assessment and Plan:  Right rib fracture Right 10th rib nondisplaced, questionable infiltrate on the left side patient at this point though seems to be doing okay.  We discussed different pain medication.  Patient thinks that she will do okay.  Discussed doxycycline for the possible infiltrate secondary to it being a potential aspiration  Patient has not had any fevers noted. Patient will get well as well.  Discussed which activities to do and which ones to avoid.  Increase activity slowly.  Follow-up with me again in 4 weeks to further evaluate.        The above documentation has been reviewed and is accurate and complete Lyndal Pulley, DO

## 2021-07-14 ENCOUNTER — Ambulatory Visit: Payer: BC Managed Care – PPO | Admitting: Family Medicine

## 2021-07-14 ENCOUNTER — Ambulatory Visit (INDEPENDENT_AMBULATORY_CARE_PROVIDER_SITE_OTHER): Payer: BC Managed Care – PPO

## 2021-07-14 ENCOUNTER — Ambulatory Visit: Payer: Self-pay

## 2021-07-14 VITALS — BP 110/82 | HR 69 | Ht 67.0 in | Wt 181.0 lb

## 2021-07-14 DIAGNOSIS — R109 Unspecified abdominal pain: Secondary | ICD-10-CM

## 2021-07-14 DIAGNOSIS — R0781 Pleurodynia: Secondary | ICD-10-CM

## 2021-07-14 DIAGNOSIS — S2231XA Fracture of one rib, right side, initial encounter for closed fracture: Secondary | ICD-10-CM | POA: Diagnosis not present

## 2021-07-14 MED ORDER — DOXYCYCLINE HYCLATE 100 MG PO TABS: 100.0000 mg | ORAL_TABLET | Freq: Two times a day (BID) | ORAL | 0 refills | Status: DC

## 2021-07-14 NOTE — Addendum Note (Signed)
Addended by: Ethlyn Daniels on: 07/14/2021 04:28 PM   Modules accepted: Orders

## 2021-07-14 NOTE — Assessment & Plan Note (Signed)
Right 10th rib nondisplaced, questionable infiltrate on the left side patient at this point though seems to be doing okay.  We discussed different pain medication.  Patient thinks that she will do okay.  Discussed doxycycline for the possible infiltrate secondary to it being a potential aspiration  Patient has not had any fevers noted. Patient will get well as well.  Discussed which activities to do and which ones to avoid.  Increase activity slowly.  Follow-up with me again in 4 weeks to further evaluate.

## 2021-07-14 NOTE — Patient Instructions (Signed)
Double up on Vit D Prescription sent in for doxycycline 10 deep breathes every hour See you again in 5-6 weeks

## 2021-07-15 ENCOUNTER — Ambulatory Visit: Payer: BC Managed Care – PPO | Admitting: Family Medicine

## 2021-08-19 NOTE — Progress Notes (Unsigned)
[name] [address] [place] [address] Phone: [phone] Subjective:   INadine Counts, am serving as a scribe for Dr. Antoine Primas.  I'm seeing this patient by the request  of:  Pincus Sanes, MD  CC: right rib fracture f/u  [id number]  07/14/2021 Right 10th rib nondisplaced, questionable infiltrate on the left side patient at this point though seems to be doing okay.  We discussed different pain medication.  Patient thinks that she will do okay.  Discussed doxycycline for the possible infiltrate secondary to it being a potential aspiration   Patient has not had any fevers noted. Patient will get well as well.  Discussed which activities to do and which ones to avoid.  Increase activity slowly.  Follow-up with me again in 4 weeks to further evaluate.  Update 08/20/2021 Cameron Katayama is a 61 y.o. female coming in with complaint of R rib fracture. Patient states doing better. Only pain when moving a certain way like twisting or laying on right side. No new complaints.      Past Medical History:  Diagnosis Date   Arthritis    Chicken pox    Gastritis    Hypertension    Neuropathy    Thyroid nodule    Past Surgical History:  Procedure Laterality Date   neg hx     Social History   Socioeconomic History   Marital status: Married    Spouse name: Not on file   Number of children: 2   Years of education: Not on file   Highest education level: Not on file  Occupational History   Occupation: Magazine features editor: [employer]  Tobacco Use   Smoking status: Never   Smokeless tobacco: Never  Substance and Sexual Activity   Alcohol use: No   Drug use: No   Sexual activity: Not on file  Other Topics Concern   Not on file  Social History Narrative   Not on file   Social Determinants of Health   Financial Resource Strain: Not on file  Food Insecurity: Not on file  Transportation Needs: Not on file   Physical Activity: Not on file  Stress: Not on file  Social Connections: Not on file   Allergies  Allergen Reactions   Pollen Extract Other (See Comments)    Per pt Per pt    Codeine Other (See Comments)    Hot flash, nausea, feels faint   Family History  Problem Relation Age of Onset   Stomach cancer Mother    Heart disease Mother    Kidney disease Mother        kidney cancer   Hypertension Mother    Diabetes Father    Coronary artery disease Father 64       CABGx5   Stroke Father        TIAs   Hypertension Father    Lymphoma Maternal Grandmother    Colon cancer Neg Hx      Current Outpatient Medications (Cardiovascular):    amLODipine (NORVASC) 5 MG tablet, TAKE 1 TABLET BY MOUTH EVERYDAY AT BEDTIME   losartan (COZAAR) 100 MG tablet, Take 1 tablet (100 mg total) by mouth daily.  Current Outpatient Medications (Respiratory):    fluticasone (FLONASE) 50 MCG/ACT nasal spray, Place 1 spray into both nostrils daily.   Current Outpatient Medications (Hematological):    Ferrous Sulfate (IRON) 325 (65 FE) MG TABS, Take 1 tablet by mouth daily.  Current Outpatient Medications (Other):    Biotin 5 MG CAPS, Take by mouth. Take 3 capsule daily   busPIRone (BUSPAR) 5 MG tablet, Take 1 tablet (5 mg total) by mouth 3 (three) times daily as needed (anxiety).   doxycycline (VIBRA-TABS) 100 MG tablet, Take 1 tablet (100 mg total) by mouth 2 (two) times daily.   Probiotic Product (PROBIOTIC DAILY PO), Take by mouth daily.   traZODone (DESYREL) 50 MG tablet, TAKE 1 TABLET BY MOUTH AT BEDTIME AS NEEDED FOR SLEEP.   VITAMIN D PO, Take 1 capsule by mouth daily.  edical record.  No pertanent information unless stated regarding to the chief complaint.   Review of Systems:  No headache, visual changes, nausea, vomiting, diarrhea, constipation, dizziness, abdominal pain, skin rash, fevers, chills, night sweats, weight loss, swollen lymph nodes, body aches, joint swelling, chest pain,  shortness of breath, mood changes. POSITIVE muscle aches  Objective  Blood pressure 122/80, pulse 81, height 5\' 7"  (1.702 m), weight 184 lb (83.5 kg), SpO2 95 %.   General: No apparent distress alert and oriented x3 mood and affect normal, dressed appropriately.  HEENT: Pupils equal, extraocular movements intact  Respiratory: Patient's speak in full sentences and does not appear short of breath  Cardiovascular: No lower extremity edema, non tender, no erythema  Patient is minimally tender to palpation over the ribs on the right side.  Significant improvement from previous exam.  Patient otherwise is able to take a deep breath with no significant difficulty.    Impression and Recommendations:     The above documentation has been reviewed and is accurate and complete , DO

## 2021-08-20 ENCOUNTER — Ambulatory Visit: Payer: BC Managed Care – PPO | Admitting: Family Medicine

## 2021-08-20 ENCOUNTER — Ambulatory Visit (INDEPENDENT_AMBULATORY_CARE_PROVIDER_SITE_OTHER): Payer: BC Managed Care – PPO

## 2021-08-20 VITALS — BP 122/80 | HR 81 | Ht 67.0 in | Wt 184.0 lb

## 2021-08-20 DIAGNOSIS — S2231XA Fracture of one rib, right side, initial encounter for closed fracture: Secondary | ICD-10-CM | POA: Diagnosis not present

## 2021-08-20 NOTE — Patient Instructions (Addendum)
Xray today Otherwise everything is looking great Have a wonderful summer See me when you need me

## 2021-10-04 ENCOUNTER — Other Ambulatory Visit: Payer: Self-pay | Admitting: Internal Medicine

## 2021-10-20 ENCOUNTER — Encounter: Payer: Self-pay | Admitting: Internal Medicine

## 2021-11-24 ENCOUNTER — Encounter: Payer: Self-pay | Admitting: Internal Medicine

## 2021-11-26 NOTE — Telephone Encounter (Signed)
Can we get these labs ordered, has an appt on Friday.

## 2021-11-27 ENCOUNTER — Encounter: Payer: Self-pay | Admitting: Internal Medicine

## 2021-11-27 NOTE — Progress Notes (Signed)
Subjective:    Patient ID: Kathleen Weber, female    DOB: 1960/07/06, 61 y.o.   MRN: [id number]      HPI Cannie is here for a Physical exam.   Overall doing well.  No she needs to continue to work on diet, exercise and weight loss.   Medications and allergies reviewed with patient and updated if appropriate.  Current Outpatient Medications on File Prior to Visit  Medication Sig Dispense Refill   amLODipine (NORVASC) 5 MG tablet TAKE 1 TABLET BY MOUTH EVERYDAY AT BEDTIME 90 tablet 3   Biotin 5 MG CAPS Take by mouth. Take 3 capsule daily     busPIRone (BUSPAR) 5 MG tablet Take 1 tablet (5 mg total) by mouth 3 (three) times daily as needed (anxiety). 60 tablet 2   Ferrous Sulfate (IRON) 325 (65 FE) MG TABS Take 1 tablet by mouth daily.     losartan (COZAAR) 100 MG tablet TAKE 1 TABLET BY MOUTH EVERY DAY 90 tablet 3   Probiotic Product (PROBIOTIC DAILY PO) Take by mouth daily.     VITAMIN D PO Take 1 capsule by mouth daily.     No current facility-administered medications on file prior to visit.    Review of Systems  Constitutional:  Negative for fever.  Eyes:  Negative for visual disturbance.  Respiratory:  Negative for cough, shortness of breath and wheezing.   Cardiovascular:  Negative for chest pain, palpitations and leg swelling.  Gastrointestinal:  Negative for abdominal pain, blood in stool, constipation, diarrhea and nausea.       No gerd  Genitourinary:  Negative for dysuria.  Musculoskeletal:  Positive for arthralgias (fingers) and neck pain. Negative for back pain.  Skin:  Negative for rash.  Neurological:  Positive for headaches (sinus at times). Negative for light-headedness.  Psychiatric/Behavioral:  Negative for dysphoric mood. The patient is nervous/anxious.        Objective:   Vitals:   11/28/21 0903  BP: 114/68  Pulse: 72  Temp: 98.5 F (36.9 C)  SpO2: 98%   Filed Weights   11/28/21 0903  Weight: 180 lb (81.6 kg)   Body mass index is  28.19 kg/m.  BP Readings from Last 3 Encounters:  11/28/21 114/68  08/20/21 122/80  07/14/21 110/82    Wt Readings from Last 3 Encounters:  11/28/21 180 lb (81.6 kg)  08/20/21 184 lb (83.5 kg)  07/14/21 181 lb (82.1 kg)       Physical Exam Constitutional: She appears well-developed and well-nourished. No distress.  HENT:  Head: Normocephalic and atraumatic.  Right Ear: External ear normal. Normal ear canal and TM Left Ear: External ear normal.  Normal ear canal and TM Mouth/Throat: Oropharynx is clear and moist.  Eyes: Conjunctivae normal.  Neck: Neck supple. No tracheal deviation present. No thyromegaly present.  No carotid bruit  Cardiovascular: Normal rate, regular rhythm and normal heart sounds.   No murmur heard.  No edema. Pulmonary/Chest: Effort normal and breath sounds normal. No respiratory distress. She has no wheezes. She has no rales.  Breast: deferred   Abdominal: Soft. She exhibits no distension. There is no tenderness.  Lymphadenopathy: She has no cervical adenopathy.  Skin: Skin is warm and dry. She is not diaphoretic.  Psychiatric: She has a normal mood and affect. Her behavior is normal.     Lab Results  Component Value Date   WBC 4.6 11/12/2020   HGB 14.5 11/12/2020   HCT 43.1 11/12/2020   PLT 183.0  11/12/2020   GLUCOSE 96 05/15/2021   CHOL 189 05/15/2021   TRIG 82.0 05/15/2021   HDL 48.60 05/15/2021   LDLCALC 124 (H) 05/15/2021   ALT 14 11/12/2020   AST 21 11/12/2020   NA 140 05/15/2021   K 3.7 05/15/2021   CL 101 05/15/2021   CREATININE 0.79 05/15/2021   BUN 15 05/15/2021   CO2 31 05/15/2021   TSH 0.99 11/12/2020   HGBA1C 6.0 05/15/2021    The 10-year ASCVD risk score (Arnett DK, et al., 2019) is: 7.7%   Values used to calculate the score:     Age: 34 years     Sex: Female     Is Non-Hispanic African American: No     Diabetic: Yes     Tobacco smoker: No     Systolic Blood Pressure: 163 mmHg     Is BP treated: Yes     HDL  Cholesterol: 48.6 mg/dL     Total Cholesterol: 189 mg/dL      Assessment & Plan:   Physical exam: Screening blood work  ordered Exercise regularly-trying to increase Weight  working on weight loss Substance abuse  none   Reviewed recommended immunizations.   Health Maintenance  Topic Date Due   MAMMOGRAM  01/08/2019   PAP SMEAR-Modifier  11/07/2019   COVID-19 Vaccine (3 - Moderna series) 12/14/2021 (Originally 07/10/2019)   COLONOSCOPY (Pts 45-60yrs Insurance coverage will need to be confirmed)  08/04/2022   TETANUS/TDAP  11/07/2028   INFLUENZA VACCINE  Completed   Hepatitis C Screening  Completed   HIV Screening  Completed   Zoster Vaccines- Shingrix  Completed   HPV VACCINES  Aged Out          See Problem List for Assessment and Plan of chronic medical problems.

## 2021-11-27 NOTE — Patient Instructions (Addendum)
    Blood work was ordered.   The lab is on the first floor.    Medications changes include : None     Return in about 1 year (around 11/29/2022) for Physical Exam.   Health Maintenance, Female Adopting a healthy lifestyle and getting preventive care are important in promoting health and wellness. Ask your health care provider about: The right schedule for you to have regular tests and exams. Things you can do on your own to prevent diseases and keep yourself healthy. What should I know about diet, weight, and exercise? Eat a healthy diet  Eat a diet that includes plenty of vegetables, fruits, low-fat dairy products, and lean protein. Do not eat a lot of foods that are high in solid fats, added sugars, or sodium. Maintain a healthy weight Body mass index (BMI) is used to identify weight problems. It estimates body fat based on height and weight. Your health care provider can help determine your BMI and help you achieve or maintain a healthy weight. Get regular exercise Get regular exercise. This is one of the most important things you can do for your health. Most adults should: Exercise for at least 150 minutes each week. The exercise should increase your heart rate and make you sweat (moderate-intensity exercise). Do strengthening exercises at least twice a week. This is in addition to the moderate-intensity exercise. Spend less time sitting. Even light physical activity can be beneficial. Watch cholesterol and blood lipids Have your blood tested for lipids and cholesterol at 62 years of age, then have this test every 5 years. Have your cholesterol levels checked more often if: Your lipid or cholesterol levels are high. You are older than 62 years of age. You are at high risk for heart disease. What should I know about cancer screening? Depending on your health history and family history, you may need to have cancer screening at various ages. This may include screening  for: Breast cancer. Cervical cancer. Colorectal cancer. Skin cancer. Lung cancer. What should I know about heart disease, diabetes, and high blood pressure? Blood pressure and heart disease High blood pressure causes heart disease and increases the risk of stroke. This is more likely to develop in people who have high blood pressure readings or are overweight. Have your blood pressure checked: Every 3-5 years if you are 18-39 years of age. Every year if you are 40 years old or older. Diabetes Have regular diabetes screenings. This checks your fasting blood sugar level. Have the screening done: Once every three years after age 40 if you are at a normal weight and have a low risk for diabetes. More often and at a younger age if you are overweight or have a high risk for diabetes. What should I know about preventing infection? Hepatitis B If you have a higher risk for hepatitis B, you should be screened for this virus. Talk with your health care provider to find out if you are at risk for hepatitis B infection. Hepatitis C Testing is recommended for: Everyone born from 1945 through 1965. Anyone with known risk factors for hepatitis C. Sexually transmitted infections (STIs) Get screened for STIs, including gonorrhea and chlamydia, if: You are sexually active and are younger than 62 years of age. You are older than 62 years of age and your health care provider tells you that you are at risk for this type of infection. Your sexual activity has changed since you were last screened, and you are at increased risk   are at increased risk for chlamydia or gonorrhea. Ask your health care provider if you are at risk. Ask your health care provider about whether you are at high risk for HIV. Your health care provider may recommend a prescription medicine to help prevent HIV infection. If you choose to take medicine to prevent HIV, you should first get tested for HIV. You should then be tested every 3 months for as long as you  are taking the medicine. Pregnancy If you are about to stop having your period (premenopausal) and you may become pregnant, seek counseling before you get pregnant. Take 400 to 800 micrograms (mcg) of folic acid every day if you become pregnant. Ask for birth control (contraception) if you want to prevent pregnancy. Osteoporosis and menopause Osteoporosis is a disease in which the bones lose minerals and strength with aging. This can result in bone fractures. If you are 8 years old or older, or if you are at risk for osteoporosis and fractures, ask your health care provider if you should: Be screened for bone loss. Take a calcium or vitamin D supplement to lower your risk of fractures. Be given hormone replacement therapy (HRT) to treat symptoms of menopause. Follow these instructions at home: Alcohol use Do not drink alcohol if: Your health care provider tells you not to drink. You are pregnant, may be pregnant, or are planning to become pregnant. If you drink alcohol: Limit how much you have to: 0-1 drink a day. Know how much alcohol is in your drink. In the [place] one drink equals one 12 oz bottle of beer (355 mL), one 5 oz glass of wine (148 mL), or one 1 oz glass of hard liquor (44 mL). Lifestyle Do not use any products that contain nicotine or tobacco. These products include cigarettes, chewing tobacco, and vaping devices, such as e-cigarettes. If you need help quitting, ask your health care provider. Do not use street drugs. Do not share needles. Ask your health care provider for help if you need support or information about quitting drugs. General instructions Schedule regular health, dental, and eye exams. Stay current with your vaccines. Tell your health care provider if: You often feel depressed. You have ever been abused or do not feel safe at home. Summary Adopting a healthy lifestyle and getting preventive care are important in promoting health and wellness. Follow your  health care provider's instructions about healthy diet, exercising, and getting tested or screened for diseases. Follow your health care provider's instructions on monitoring your cholesterol and blood pressure. This information is not intended to replace advice given to you by your health care provider. Make sure you discuss any questions you have with your health care provider. Document Revised: 06/17/2020 Document Reviewed: 06/17/2020 Elsevier Patient Education  [place].

## 2021-11-28 ENCOUNTER — Ambulatory Visit (INDEPENDENT_AMBULATORY_CARE_PROVIDER_SITE_OTHER): Payer: BC Managed Care – PPO | Admitting: Internal Medicine

## 2021-11-28 ENCOUNTER — Encounter: Payer: Self-pay | Admitting: Internal Medicine

## 2021-11-28 VITALS — BP 114/68 | HR 72 | Temp 98.5°F | Ht 67.0 in | Wt 180.0 lb

## 2021-11-28 DIAGNOSIS — F419 Anxiety disorder, unspecified: Secondary | ICD-10-CM

## 2021-11-28 DIAGNOSIS — E611 Iron deficiency: Secondary | ICD-10-CM | POA: Diagnosis not present

## 2021-11-28 DIAGNOSIS — Z0000 Encounter for general adult medical examination without abnormal findings: Secondary | ICD-10-CM

## 2021-11-28 DIAGNOSIS — E782 Mixed hyperlipidemia: Secondary | ICD-10-CM | POA: Diagnosis not present

## 2021-11-28 DIAGNOSIS — R739 Hyperglycemia, unspecified: Secondary | ICD-10-CM | POA: Diagnosis not present

## 2021-11-28 DIAGNOSIS — I10 Essential (primary) hypertension: Secondary | ICD-10-CM | POA: Diagnosis not present

## 2021-11-28 DIAGNOSIS — J309 Allergic rhinitis, unspecified: Secondary | ICD-10-CM

## 2021-11-28 LAB — COMPREHENSIVE METABOLIC PANEL
ALT: 27 U/L (ref 0–35)
AST: 31 U/L (ref 0–37)
Albumin: 4.6 g/dL (ref 3.5–5.2)
Alkaline Phosphatase: 66 U/L (ref 39–117)
BUN: 18 mg/dL (ref 6–23)
CO2: 29 mEq/L (ref 19–32)
Calcium: 9.7 mg/dL (ref 8.4–10.5)
Chloride: 100 mEq/L (ref 96–112)
Creatinine, Ser: 0.78 mg/dL (ref 0.40–1.20)
GFR: 81.69 mL/min (ref >60.00)
Glucose, Bld: 94 mg/dL (ref 70–99)
Potassium: 3.7 mEq/L (ref 3.5–5.1)
Sodium: 139 mEq/L (ref 135–145)
Total Bilirubin: 0.7 mg/dL (ref 0.2–1.2)
Total Protein: 7.8 g/dL (ref 6.0–8.3)

## 2021-11-28 LAB — CBC WITH DIFFERENTIAL/PLATELET
Basophils Absolute: 0 10*3/uL (ref 0.0–0.1)
Basophils Relative: 0.8 % (ref 0.0–3.0)
Eosinophils Absolute: 0.1 10*3/uL (ref 0.0–0.7)
Eosinophils Relative: 1.6 % (ref 0.0–5.0)
HCT: 43.5 % (ref 36.0–46.0)
Hemoglobin: 14.6 g/dL (ref 12.0–15.0)
Lymphocytes Relative: 45 % (ref 12.0–46.0)
Lymphs Abs: 2.1 10*3/uL (ref 0.7–4.0)
MCHC: 33.7 g/dL (ref 30.0–36.0)
MCV: 84.4 fl (ref 78.0–100.0)
Monocytes Absolute: 0.4 10*3/uL (ref 0.1–1.0)
Monocytes Relative: 8.6 % (ref 3.0–12.0)
Neutro Abs: 2 10*3/uL (ref 1.4–7.7)
Neutrophils Relative %: 44 % (ref 43.0–77.0)
Platelets: 184 10*3/uL (ref 150.0–400.0)
RBC: 5.15 Mil/uL — ABNORMAL HIGH (ref 3.87–5.11)
RDW: 13.1 % (ref 11.5–15.5)
WBC: 4.6 10*3/uL (ref 4.0–10.5)

## 2021-11-28 LAB — LIPID PANEL
Cholesterol: 186 mg/dL (ref 0–200)
HDL: 52.5 mg/dL (ref >39.00)
LDL Cholesterol: 118 mg/dL — ABNORMAL HIGH (ref 0–99)
NonHDL: 133.5
Total CHOL/HDL Ratio: 4
Triglycerides: 77 mg/dL (ref 0.0–149.0)
VLDL: 15.4 mg/dL (ref 0.0–40.0)

## 2021-11-28 LAB — FERRITIN: Ferritin: 298.2 ng/mL — ABNORMAL HIGH (ref 10.0–291.0)

## 2021-11-28 LAB — IBC PANEL
Iron: 73 ug/dL (ref 42–145)
Saturation Ratios: 20.1 % (ref 20.0–50.0)
TIBC: 364 ug/dL (ref 250.0–450.0)
Transferrin: 260 mg/dL (ref 212.0–360.0)

## 2021-11-28 LAB — HEMOGLOBIN A1C: Hgb A1c MFr Bld: 6 % (ref 4.6–6.5)

## 2021-11-28 LAB — TSH: TSH: 0.97 u[IU]/mL (ref 0.35–5.50)

## 2021-11-28 MED ORDER — FLUTICASONE PROPIONATE 50 MCG/ACT NA SUSP: 1.0000 | Freq: Every day | NASAL | 5 refills | Status: DC

## 2021-11-28 NOTE — Assessment & Plan Note (Signed)
Chronic Check a1c Low sugar / carb diet Stressed regular exercise  

## 2021-11-28 NOTE — Assessment & Plan Note (Signed)
Chronic Likely secondary to being a vegetarian Taking iron supplementation Check iron levels, CBC

## 2021-11-28 NOTE — Assessment & Plan Note (Signed)
Chronic Blood pressure well controlled CMP Continue amlodipine 5 mg daily, losartan 100 mg daily 

## 2021-11-28 NOTE — Assessment & Plan Note (Signed)
Chronic Regular exercise and healthy diet encouraged Check lipid panel  Continue lifestyle control  Lab Results  Component Value Date   LDLCALC 124 (H) 05/15/2021

## 2021-11-28 NOTE — Assessment & Plan Note (Signed)
Chronic Continue Flonase as needed-refilled today

## 2021-11-28 NOTE — Assessment & Plan Note (Addendum)
Chronic Controlled, Stable Taking magnesium glycinate Exercising Continue buspirone 5 mg 3 times daily as needed, which she has not tried, but will keep on hand if she needs it

## 2021-11-29 ENCOUNTER — Encounter: Payer: Self-pay | Admitting: Internal Medicine

## 2021-12-04 ENCOUNTER — Encounter: Payer: Self-pay | Admitting: Family Medicine

## 2021-12-09 ENCOUNTER — Ambulatory Visit: Payer: BC Managed Care – PPO | Admitting: Emergency Medicine

## 2021-12-18 ENCOUNTER — Encounter: Payer: Self-pay | Admitting: Internal Medicine

## 2021-12-18 NOTE — Progress Notes (Signed)
Outside notes received. Information abstracted. Notes sent to scan.  

## 2021-12-29 ENCOUNTER — Encounter: Payer: Self-pay | Admitting: Internal Medicine

## 2022-01-18 IMAGING — DX DG WRIST COMPLETE 3+V*L*
4 series · 4 of 4 positions shown · non-contrast
Comparison: None.

CLINICAL DATA: Left wrist pain following 2 recent falls.

EXAM:
LEFT WRIST - COMPLETE 3+ VIEW

[wrist ap]
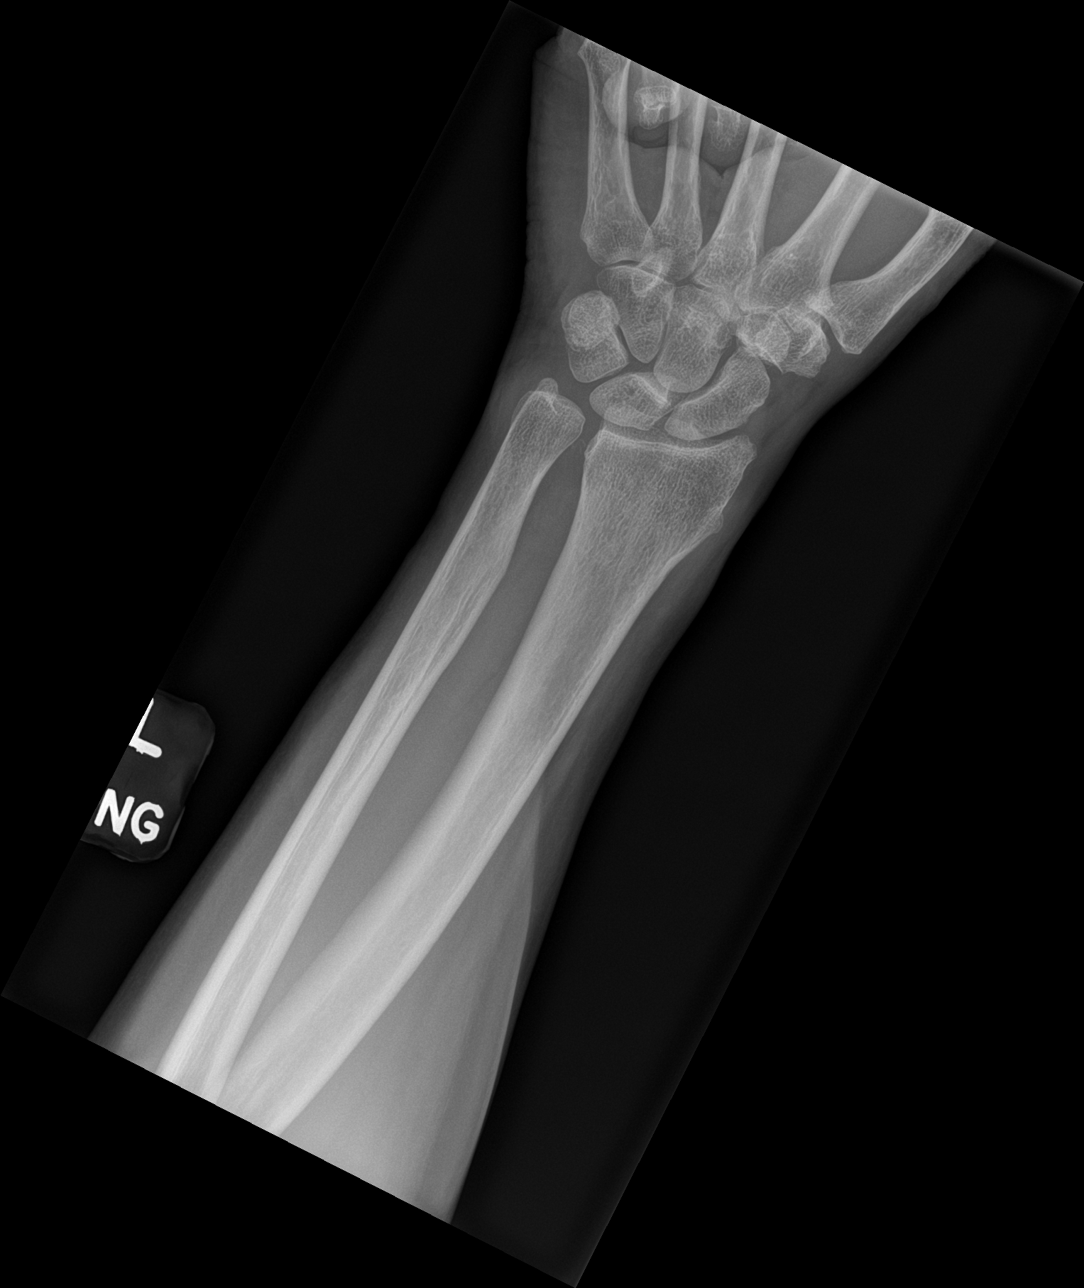

[wrist obl]
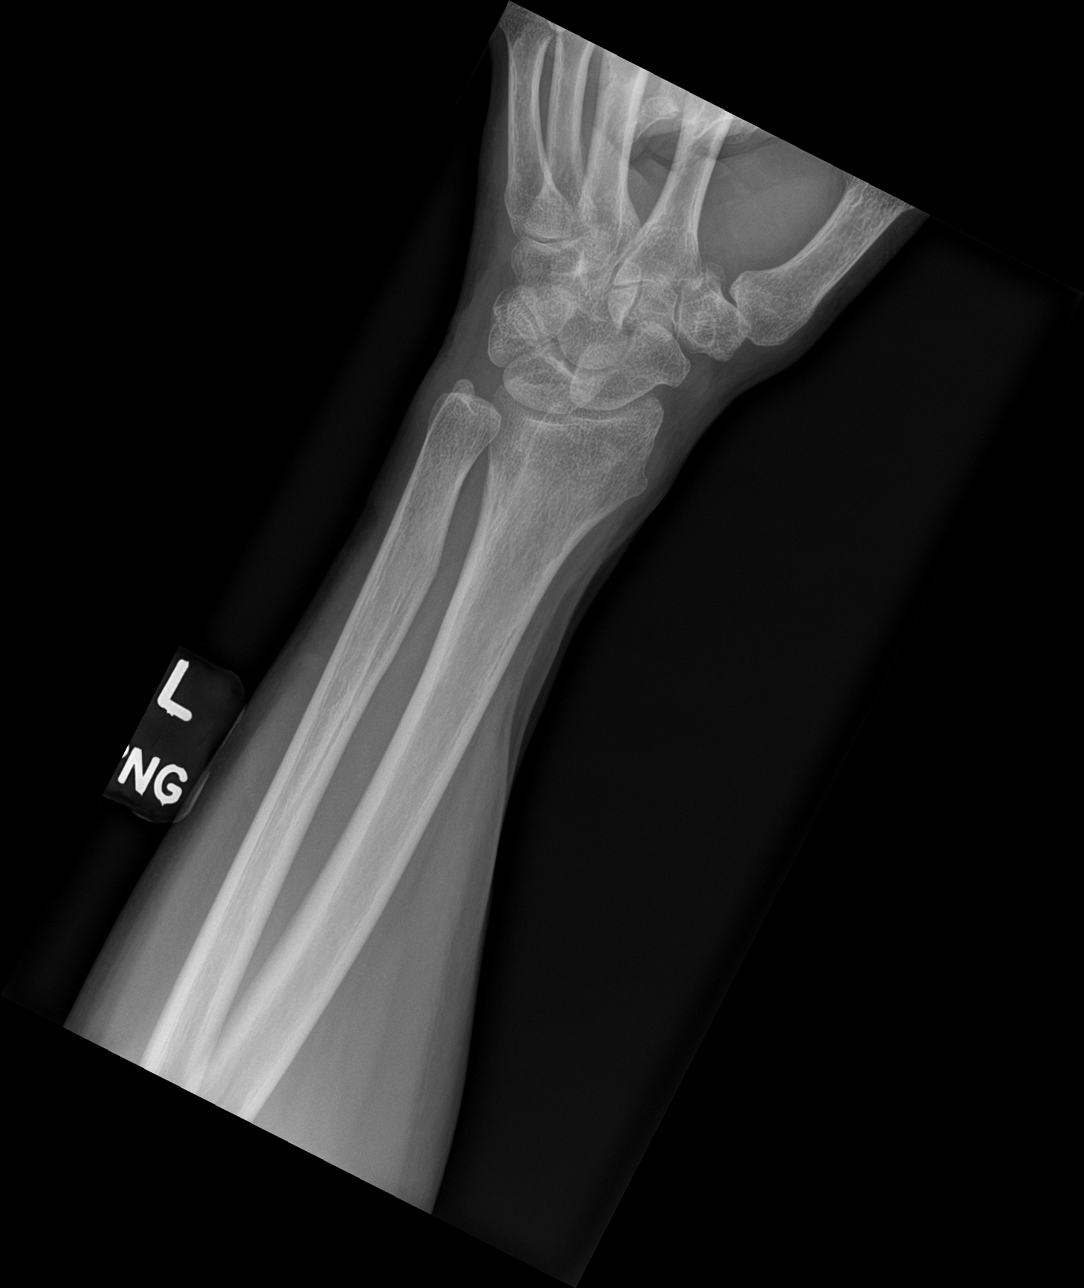

[wrist lat]
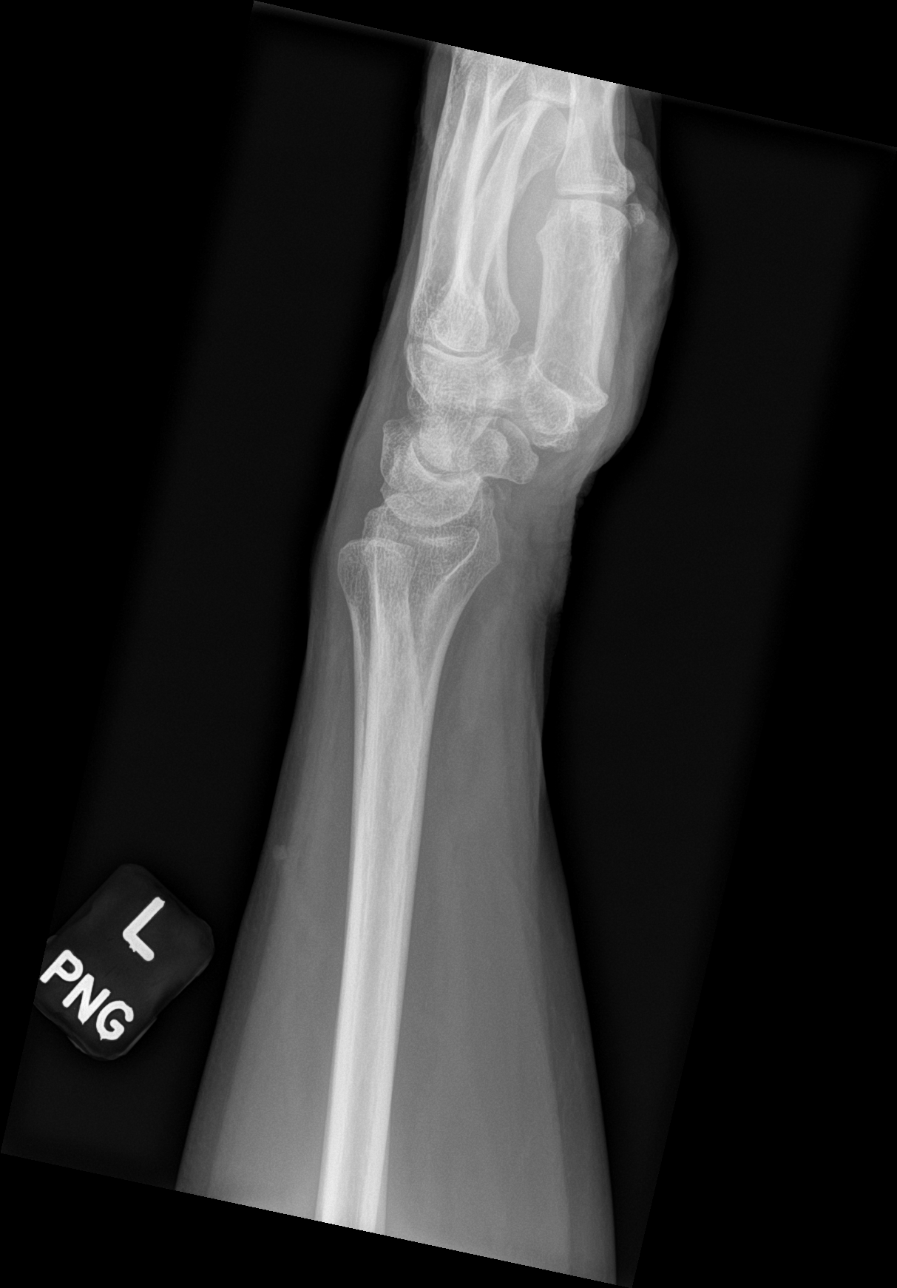

[scaphoid]
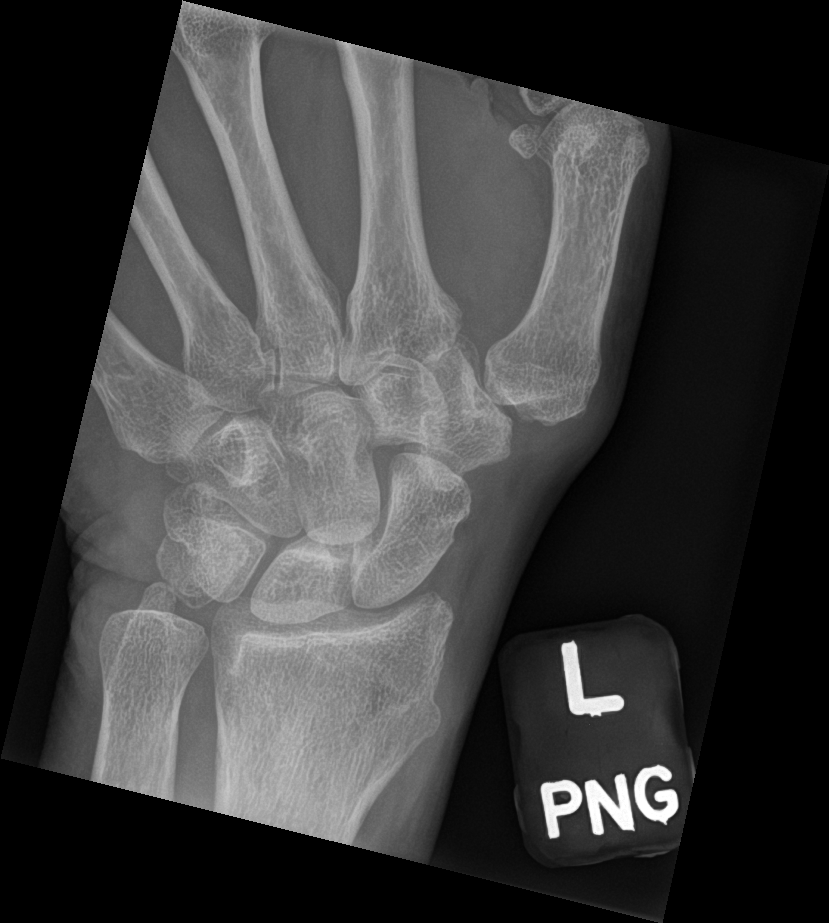

[4 of 4 positions shown; findings below may reference images not displayed]

FINDINGS: Small corticated bone fragment adjacent to the ventral aspect of the
carpal bones on the lateral view. No acute fracture or dislocation
seen.
IMPRESSION: No acute fracture.

## 2022-01-18 IMAGING — DX DG ANKLE COMPLETE 3+V*L*
3 series · 3 of 3 positions shown · non-contrast
Comparison: None.

CLINICAL DATA: Left ankle pain and swelling following 2 recent
falls.

EXAM:
LEFT ANKLE COMPLETE - 3+ VIEW

[ankle ap]
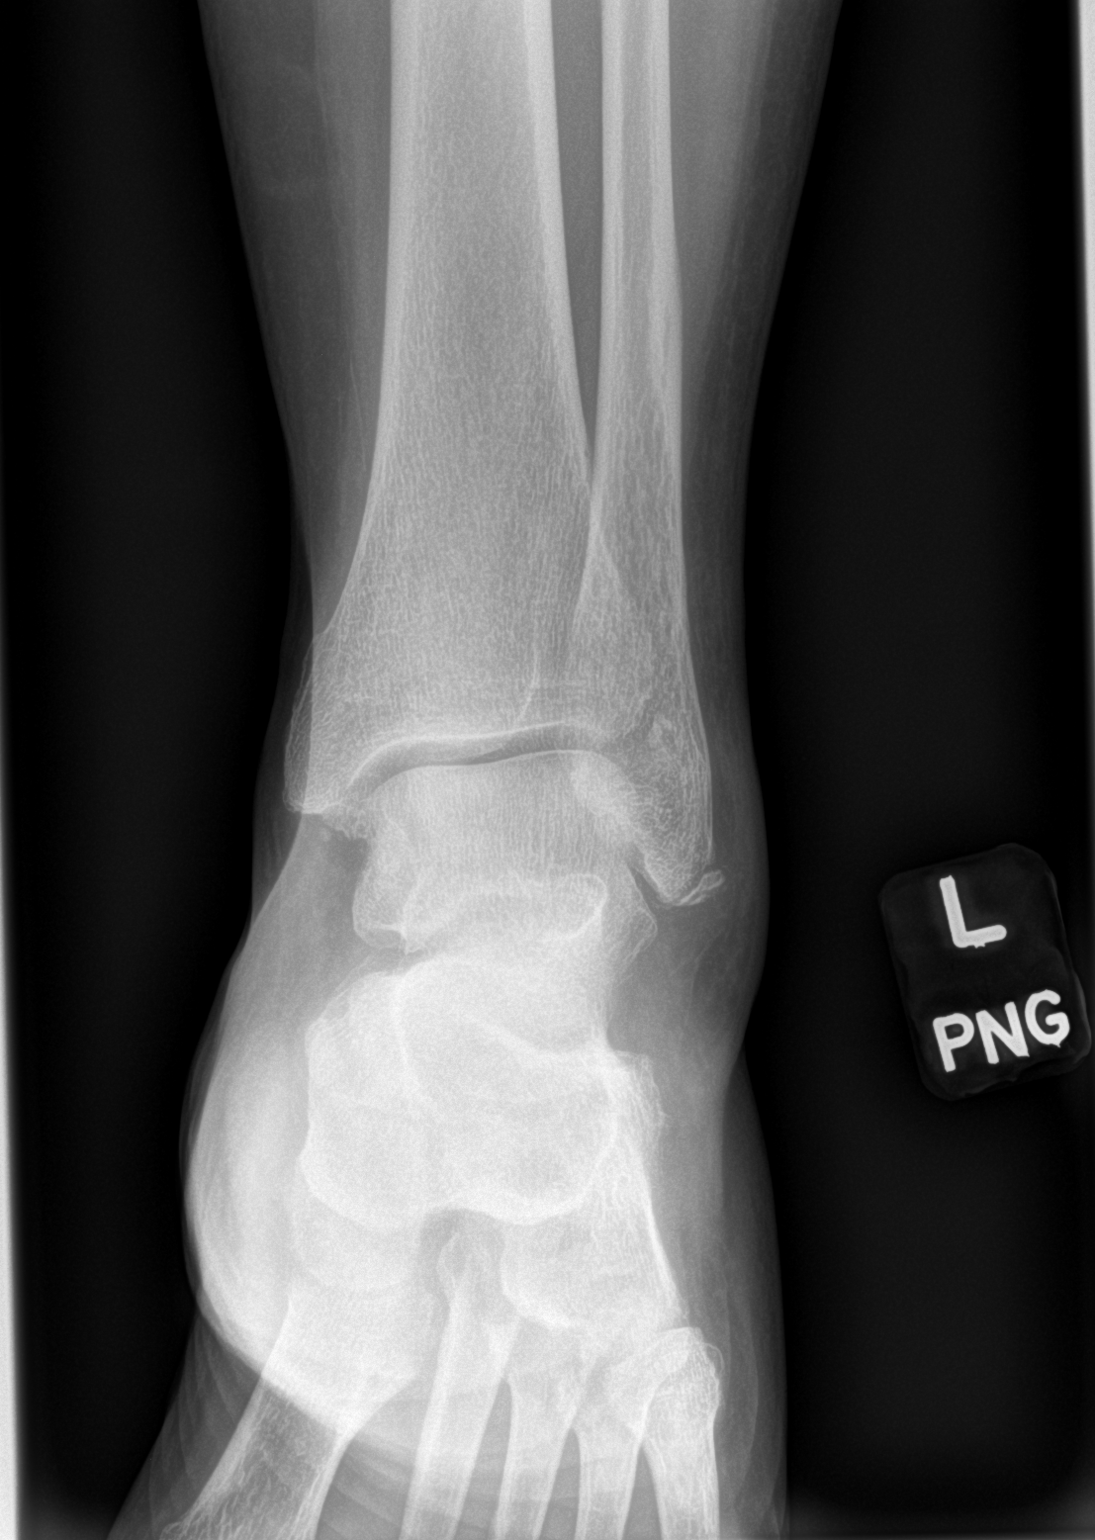

[ankle obl]
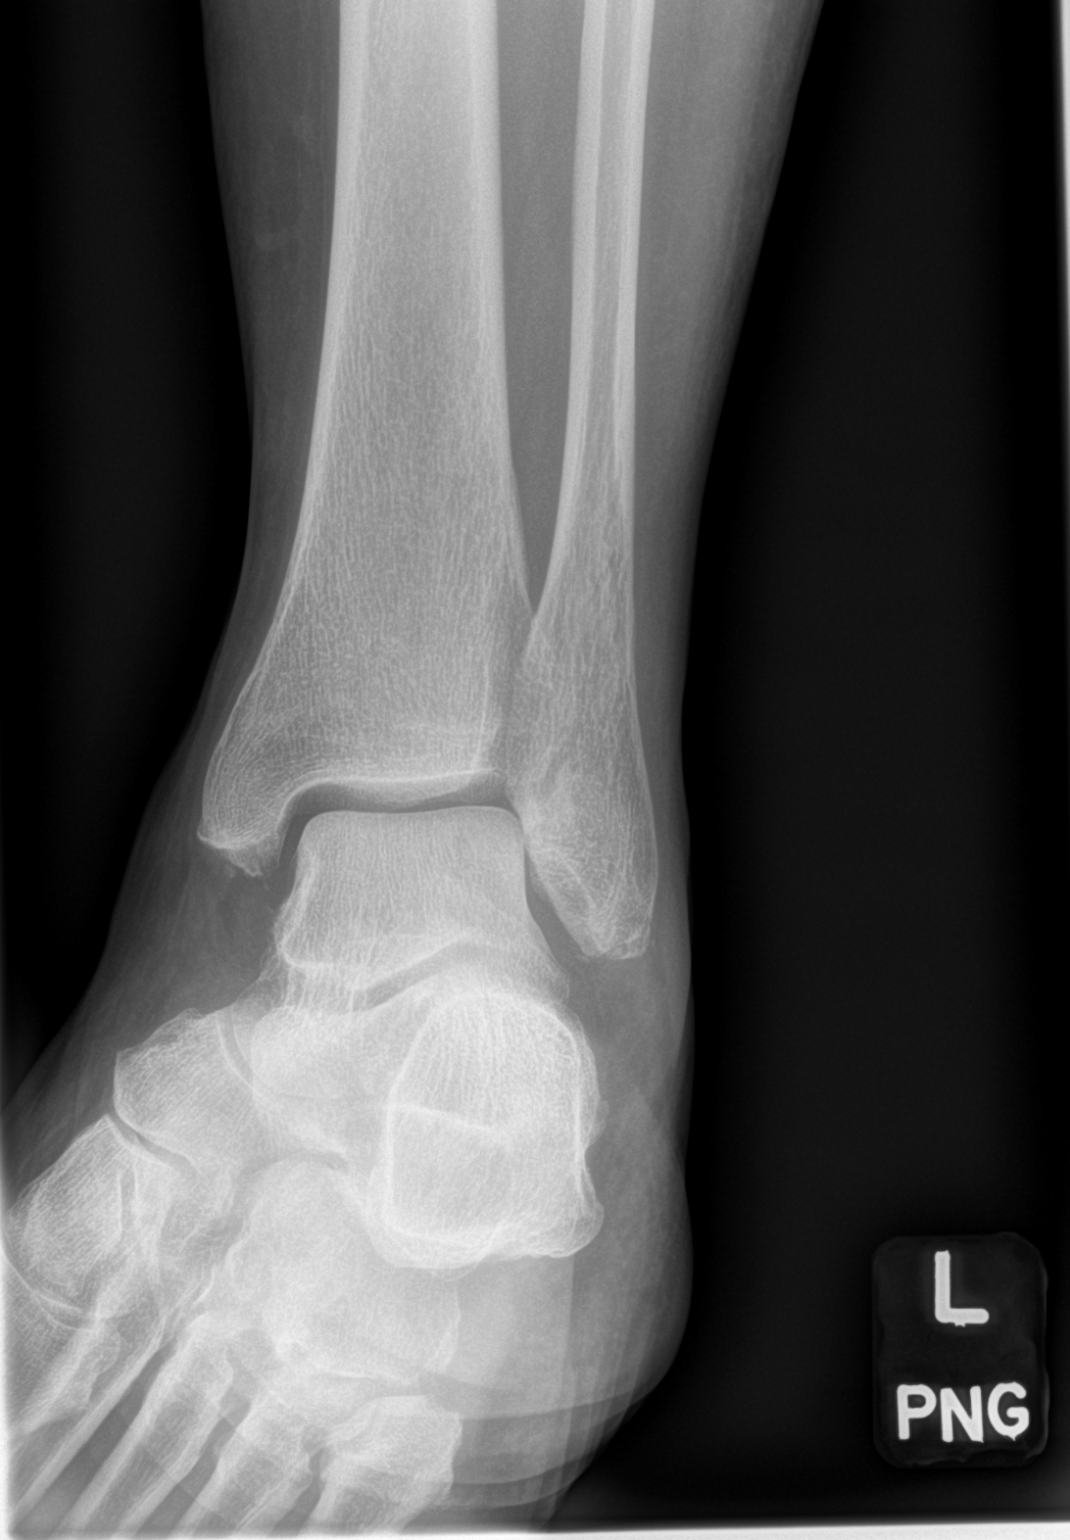

[ankle lat]
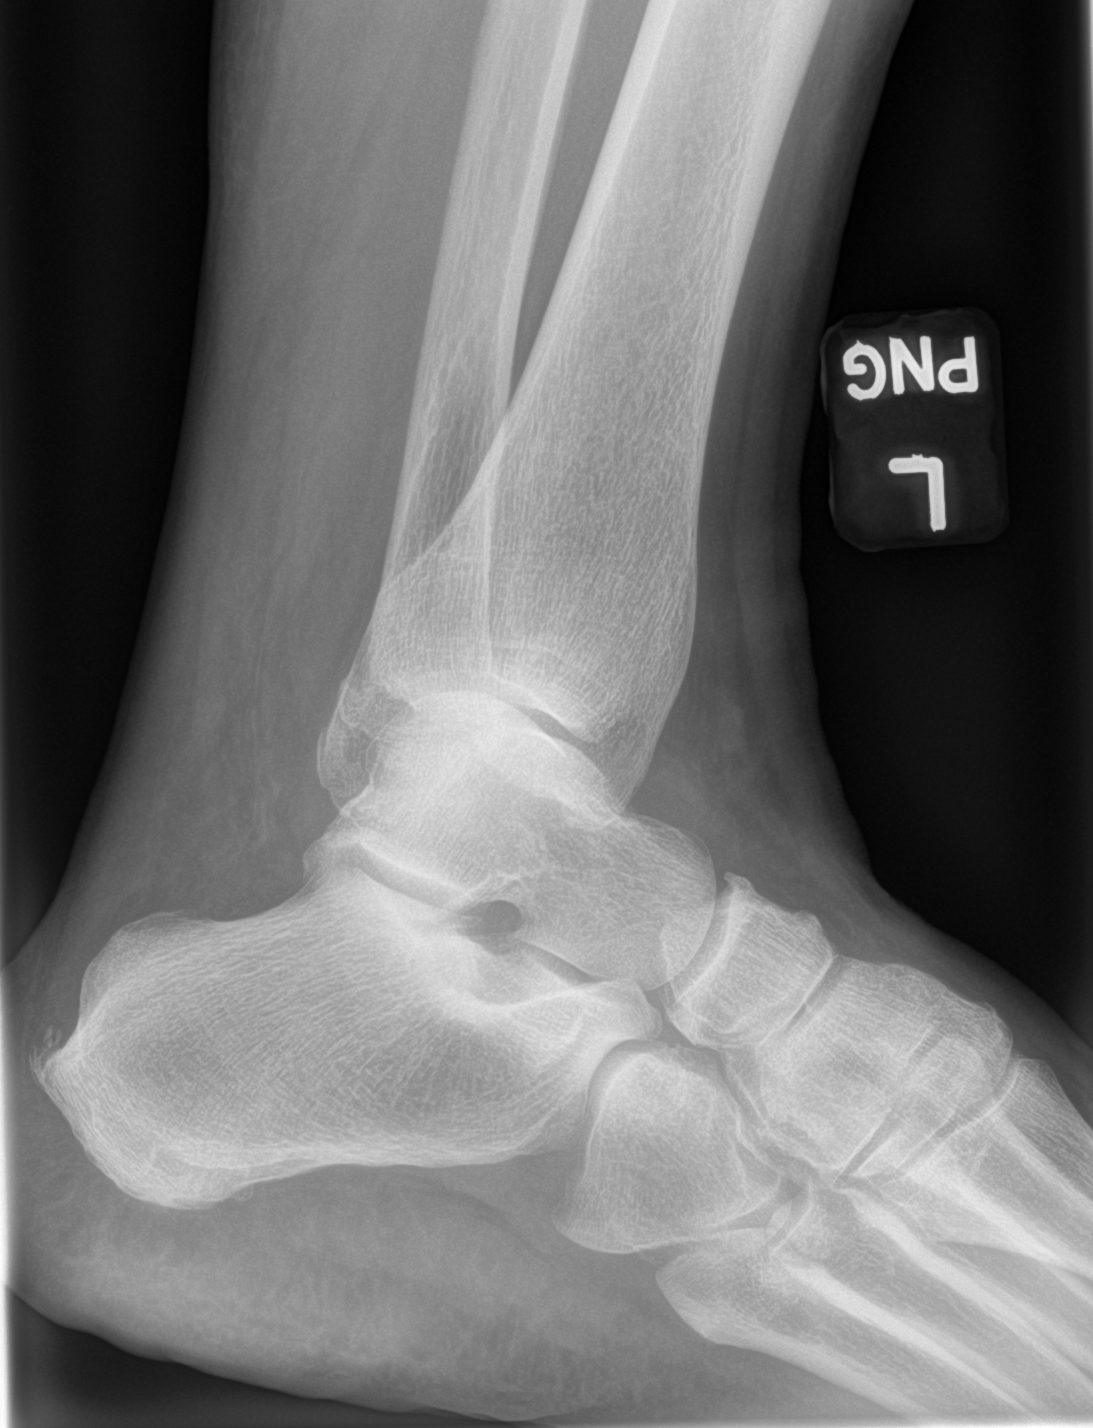

[3 of 3 positions shown; findings below may reference images not displayed]

FINDINGS: Mild to moderate lateral soft tissue swelling. Since a nondisplaced
fracture through the distal aspect of the lateral malleolus. Small
avulsion fracture fragment adjacent to the distal aspect of the
medial malleolus. Associated ankle joint effusion. Mild posterior
calcaneal enthesophyte formation.
IMPRESSION: 1. Essentially nondisplaced fracture of the distal aspect of the
lateral malleolus with associated soft tissue swelling.
2. Small avulsion fracture off the distal aspect of the medial
malleolus, age indeterminate.
3. Ankle joint effusion.

## 2022-01-27 ENCOUNTER — Encounter: Payer: Self-pay | Admitting: Family Medicine

## 2022-01-27 ENCOUNTER — Encounter: Payer: Self-pay | Admitting: Internal Medicine

## 2022-02-24 IMAGING — DX DG ANKLE COMPLETE 3+V*L*
3 series · 3 of 3 positions shown · non-contrast
Comparison: 06/12/2020

CLINICAL DATA: Follow-up ankle fracture, subsequent encounter

EXAM:
LEFT ANKLE COMPLETE - 3+ VIEW

[ankle ap]
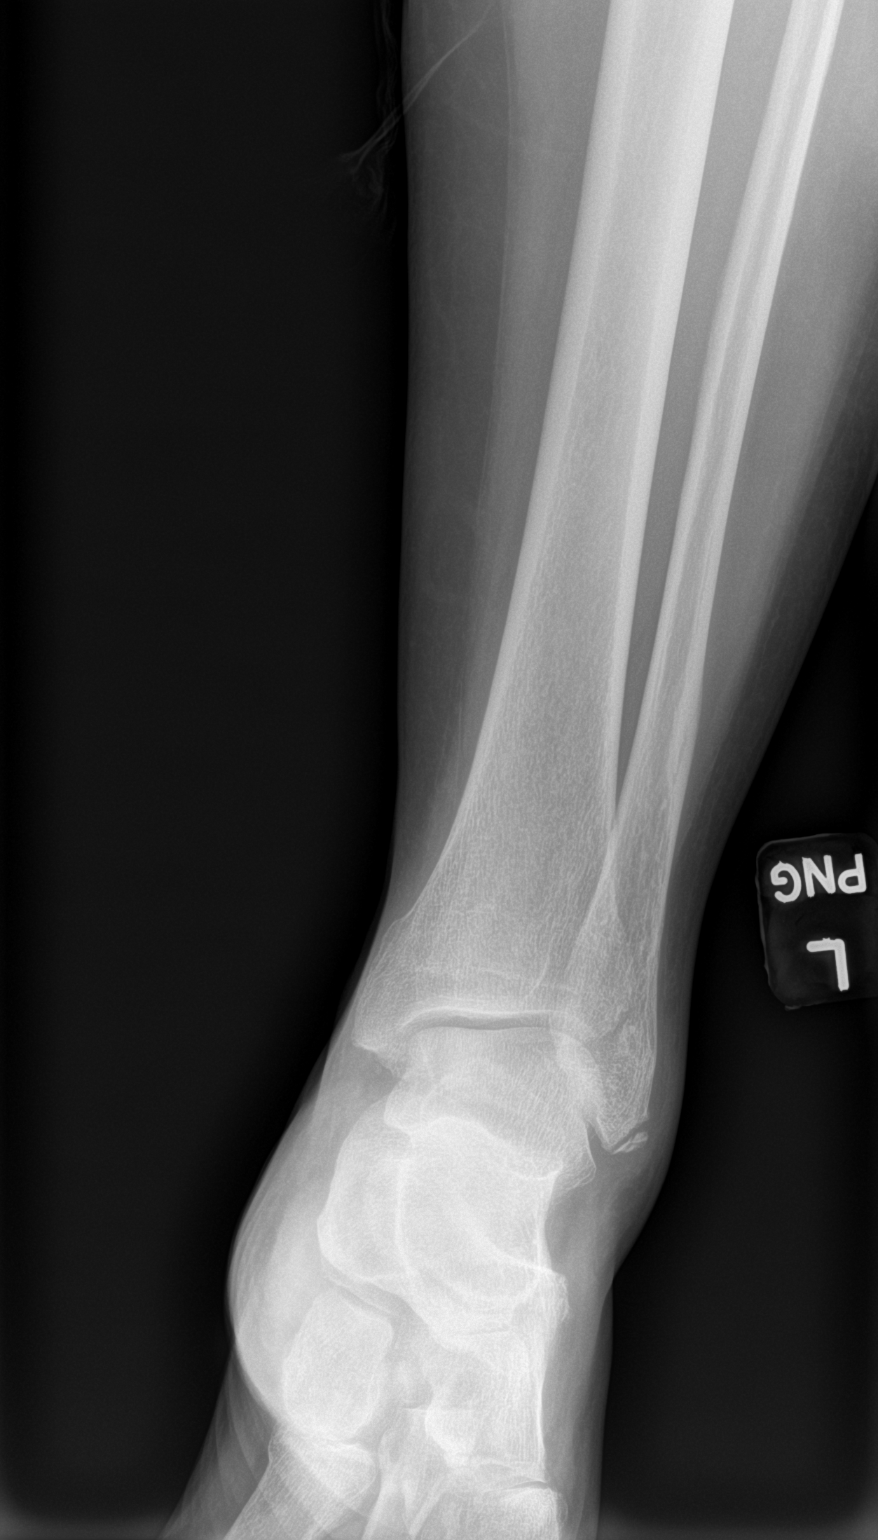

[ankle obl]
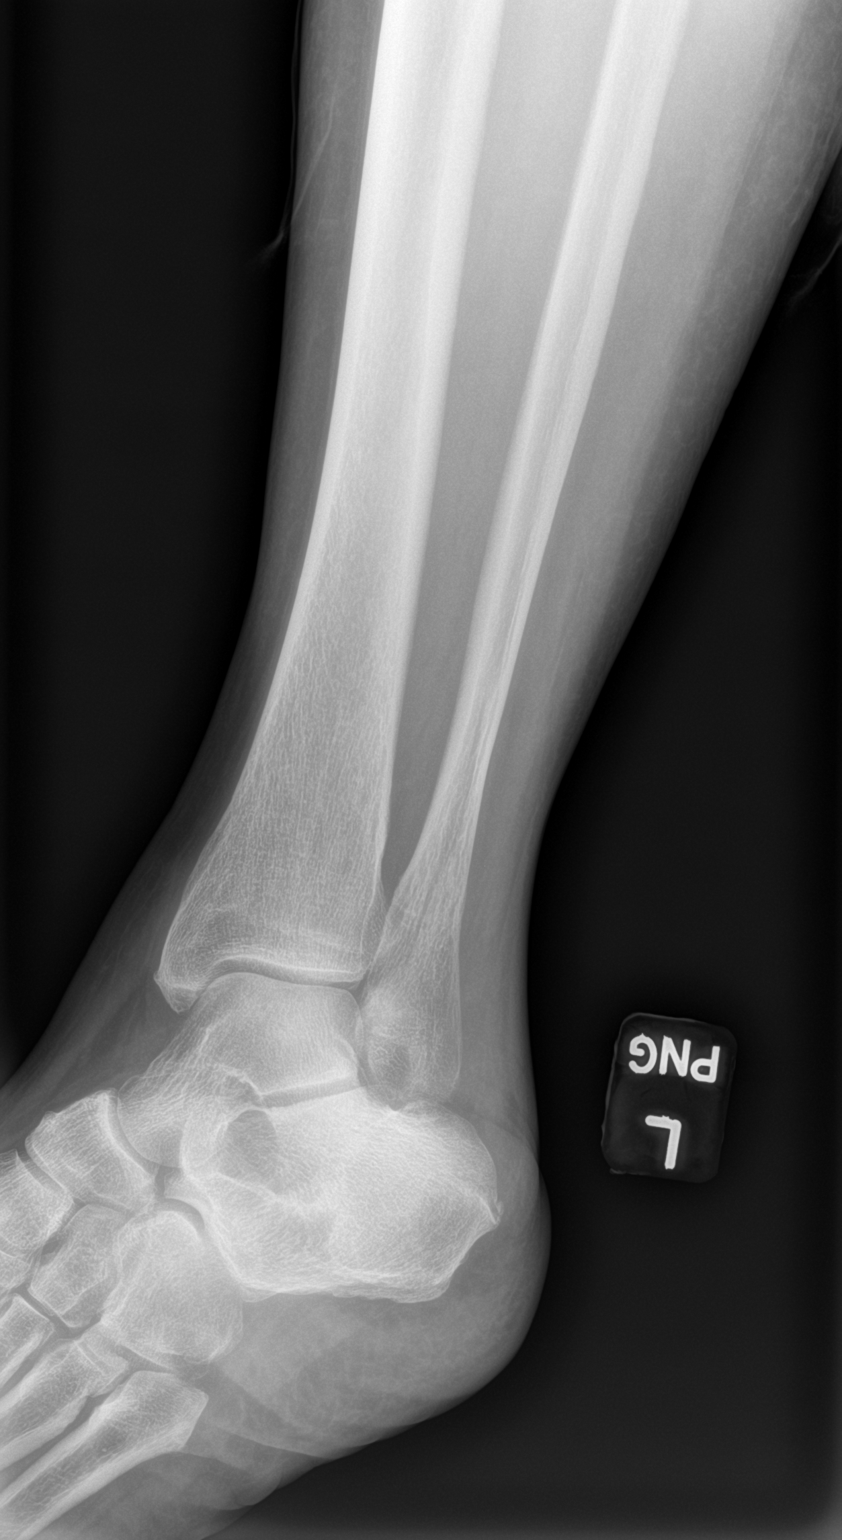

[ankle lat]
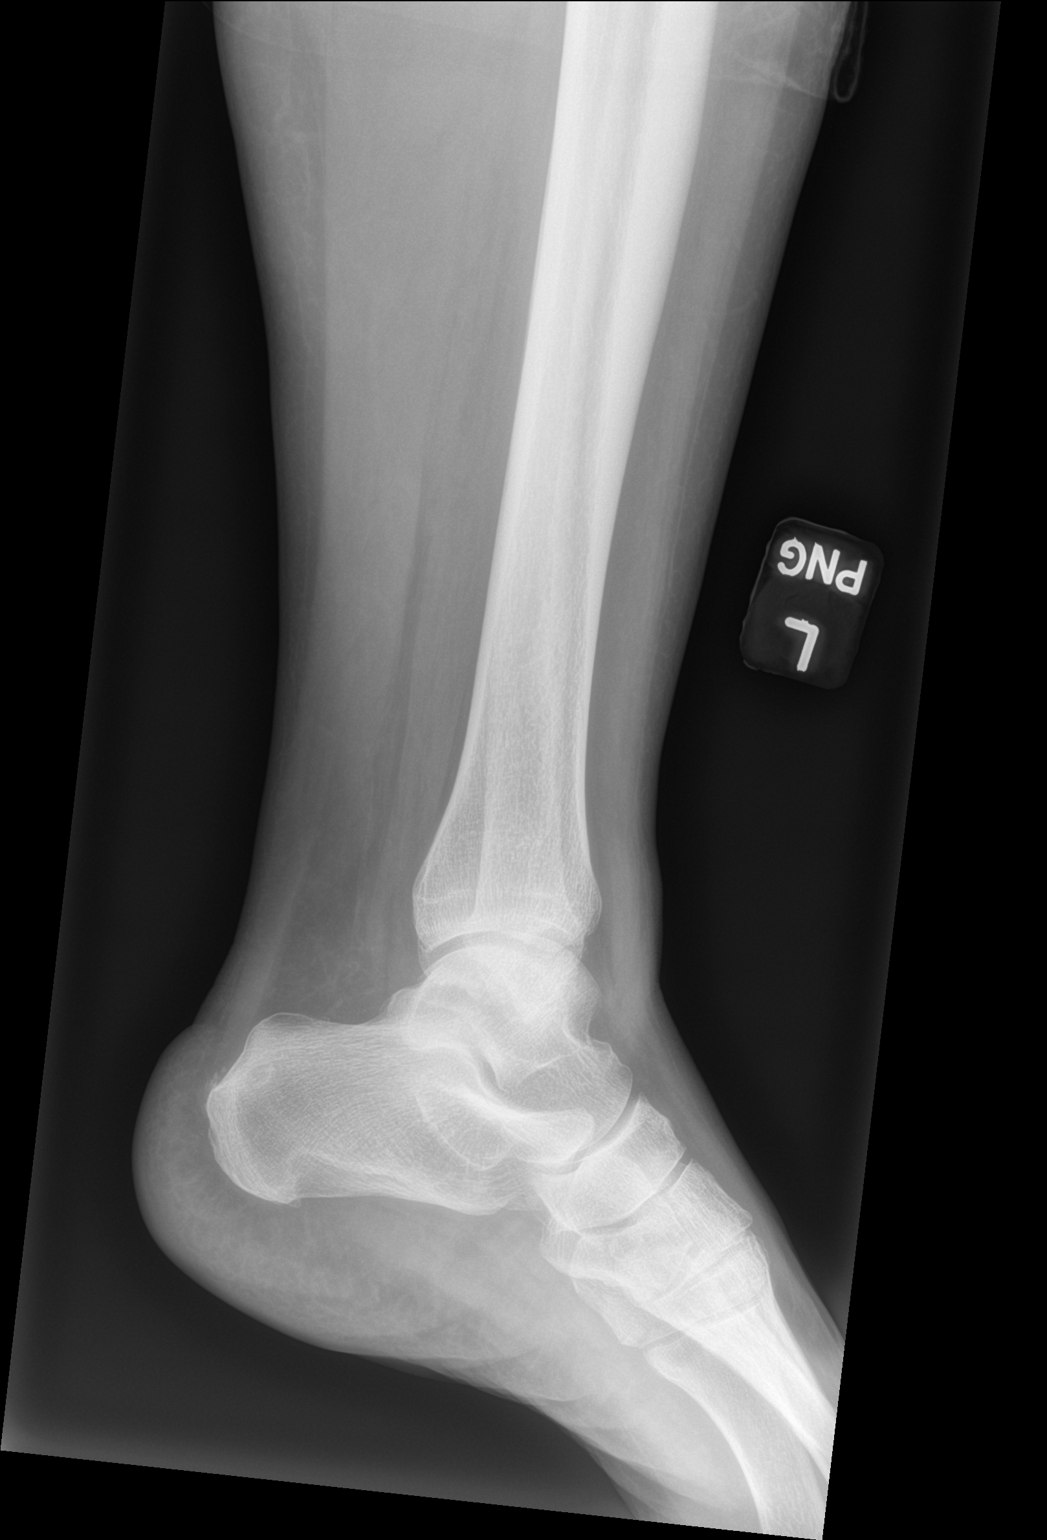

[3 of 3 positions shown; findings below may reference images not displayed]

FINDINGS: Previously seen distal fibular fracture is again identified.
Fracture fragment is well corticated without significant union. Mild
soft tissue swelling is noted about the ankle. No new fractures are
seen.
IMPRESSION: Distal fibular fracture without union. The fracture fragment is well
corticated.

## 2022-03-11 ENCOUNTER — Encounter: Payer: Self-pay | Admitting: Internal Medicine

## 2022-04-22 LAB — HM MAMMOGRAPHY

## 2022-04-24 LAB — HM PAP SMEAR

## 2022-06-08 ENCOUNTER — Encounter: Payer: Self-pay | Admitting: Internal Medicine

## 2022-08-07 ENCOUNTER — Other Ambulatory Visit: Payer: Self-pay | Admitting: Internal Medicine

## 2022-08-08 ENCOUNTER — Other Ambulatory Visit: Payer: Self-pay | Admitting: Internal Medicine

## 2022-10-22 ENCOUNTER — Other Ambulatory Visit: Payer: Self-pay | Admitting: Internal Medicine

## 2022-11-06 ENCOUNTER — Telehealth: Payer: Self-pay | Admitting: Internal Medicine

## 2022-11-06 DIAGNOSIS — E611 Iron deficiency: Secondary | ICD-10-CM

## 2022-11-06 NOTE — Telephone Encounter (Signed)
Patient has an appointment scheduled for 12/04/2022. She would like to know if she can get blood work done prior to that appointment. Patient would like a call back at [phone].

## 2022-11-12 ENCOUNTER — Other Ambulatory Visit: Payer: Self-pay

## 2022-11-12 DIAGNOSIS — I10 Essential (primary) hypertension: Secondary | ICD-10-CM

## 2022-11-12 DIAGNOSIS — F419 Anxiety disorder, unspecified: Secondary | ICD-10-CM

## 2022-11-12 DIAGNOSIS — Z0000 Encounter for general adult medical examination without abnormal findings: Secondary | ICD-10-CM

## 2022-11-12 DIAGNOSIS — R739 Hyperglycemia, unspecified: Secondary | ICD-10-CM

## 2022-11-12 DIAGNOSIS — E782 Mixed hyperlipidemia: Secondary | ICD-10-CM

## 2022-11-12 NOTE — Addendum Note (Signed)
Addended by: Pincus Sanes on: 11/12/2022 04:40 PM   Modules accepted: Orders

## 2022-12-02 ENCOUNTER — Other Ambulatory Visit (INDEPENDENT_AMBULATORY_CARE_PROVIDER_SITE_OTHER): Payer: BC Managed Care – PPO

## 2022-12-02 DIAGNOSIS — Z0000 Encounter for general adult medical examination without abnormal findings: Secondary | ICD-10-CM | POA: Diagnosis not present

## 2022-12-02 DIAGNOSIS — E611 Iron deficiency: Secondary | ICD-10-CM | POA: Diagnosis not present

## 2022-12-02 DIAGNOSIS — R739 Hyperglycemia, unspecified: Secondary | ICD-10-CM

## 2022-12-02 DIAGNOSIS — E782 Mixed hyperlipidemia: Secondary | ICD-10-CM

## 2022-12-02 DIAGNOSIS — F419 Anxiety disorder, unspecified: Secondary | ICD-10-CM | POA: Diagnosis not present

## 2022-12-02 DIAGNOSIS — I10 Essential (primary) hypertension: Secondary | ICD-10-CM

## 2022-12-02 LAB — LIPID PANEL
Cholesterol: 205 mg/dL — ABNORMAL HIGH (ref 0–200)
HDL: 51.2 mg/dL (ref >39.00)
LDL Cholesterol: 131 mg/dL — ABNORMAL HIGH (ref 0–99)
NonHDL: 153.61
Total CHOL/HDL Ratio: 4
Triglycerides: 113 mg/dL (ref 0.0–149.0)
VLDL: 22.6 mg/dL (ref 0.0–40.0)

## 2022-12-02 LAB — CBC WITH DIFFERENTIAL/PLATELET
Basophils Absolute: 0 10*3/uL (ref 0.0–0.1)
Basophils Relative: 0.8 % (ref 0.0–3.0)
Eosinophils Absolute: 0.1 10*3/uL (ref 0.0–0.7)
Eosinophils Relative: 1.3 % (ref 0.0–5.0)
HCT: 43.9 % (ref 36.0–46.0)
Hemoglobin: 14.8 g/dL (ref 12.0–15.0)
Lymphocytes Relative: 40.7 % (ref 12.0–46.0)
Lymphs Abs: 2.1 10*3/uL (ref 0.7–4.0)
MCHC: 33.6 g/dL (ref 30.0–36.0)
MCV: 85.9 fL (ref 78.0–100.0)
Monocytes Absolute: 0.5 10*3/uL (ref 0.1–1.0)
Monocytes Relative: 9 % (ref 3.0–12.0)
Neutro Abs: 2.5 10*3/uL (ref 1.4–7.7)
Neutrophils Relative %: 48.2 % (ref 43.0–77.0)
Platelets: 194 10*3/uL (ref 150.0–400.0)
RBC: 5.11 Mil/uL (ref 3.87–5.11)
RDW: 12.8 % (ref 11.5–15.5)
WBC: 5.1 10*3/uL (ref 4.0–10.5)

## 2022-12-02 LAB — COMPREHENSIVE METABOLIC PANEL
ALT: 14 U/L (ref 0–35)
AST: 21 U/L (ref 0–37)
Albumin: 4.3 g/dL (ref 3.5–5.2)
Alkaline Phosphatase: 71 U/L (ref 39–117)
BUN: 15 mg/dL (ref 6–23)
CO2: 32 meq/L (ref 19–32)
Calcium: 9.4 mg/dL (ref 8.4–10.5)
Chloride: 100 meq/L (ref 96–112)
Creatinine, Ser: 0.79 mg/dL (ref 0.40–1.20)
GFR: 79.89 mL/min (ref >60.00)
Glucose, Bld: 96 mg/dL (ref 70–99)
Potassium: 3.7 meq/L (ref 3.5–5.1)
Sodium: 139 meq/L (ref 135–145)
Total Bilirubin: 0.8 mg/dL (ref 0.2–1.2)
Total Protein: 7.7 g/dL (ref 6.0–8.3)

## 2022-12-02 LAB — IBC PANEL
Iron: 82 ug/dL (ref 42–145)
Saturation Ratios: 21.8 % (ref 20.0–50.0)
TIBC: 376.6 ug/dL (ref 250.0–450.0)
Transferrin: 269 mg/dL (ref 212.0–360.0)

## 2022-12-02 LAB — HEMOGLOBIN A1C: Hgb A1c MFr Bld: 5.7 % (ref 4.6–6.5)

## 2022-12-02 LAB — TSH: TSH: 1.16 u[IU]/mL (ref 0.35–5.50)

## 2022-12-02 LAB — FERRITIN: Ferritin: 253.4 ng/mL (ref 10.0–291.0)

## 2022-12-03 ENCOUNTER — Encounter: Payer: Self-pay | Admitting: Internal Medicine

## 2022-12-03 NOTE — Progress Notes (Signed)
Subjective:    Patient ID: Kathleen Weber, female    DOB: 1960-06-01, 62 y.o.   MRN: [id number]      HPI Kathleen Weber is here for a Physical exam and her chronic medical problems.   She is having increased stress - seeing a therapist.  She is a caregiver for her husband.    Wants to lose weight - working with a nutritionist, not exercising.   Medications and allergies reviewed with patient and updated if appropriate.  Current Outpatient Medications on File Prior to Visit  Medication Sig Dispense Refill   amLODipine (NORVASC) 5 MG tablet TAKE 1 TABLET BY MOUTH EVERYDAY AT BEDTIME 90 tablet 3   Biotin 5 MG CAPS Take by mouth. Take 3 capsule daily     Ferrous Sulfate (IRON) 325 (65 FE) MG TABS Take 1 tablet by mouth daily.     fluticasone (FLONASE) 50 MCG/ACT nasal spray Place 1 spray into both nostrils daily. 48 mL 5   losartan (COZAAR) 100 MG tablet TAKE 1 TABLET BY MOUTH EVERY DAY 90 tablet 1   Probiotic Product (PROBIOTIC DAILY PO) Take by mouth daily.     VITAMIN D PO Take 1 capsule by mouth daily.     No current facility-administered medications on file prior to visit.    Review of Systems  Constitutional:  Negative for fever.  Eyes:  Negative for visual disturbance.  Respiratory:  Negative for cough, shortness of breath and wheezing.   Cardiovascular:  Positive for chest pain (wtih stress) and leg swelling (with excessive salt intake). Negative for palpitations.  Gastrointestinal:  Positive for diarrhea (IBS). Negative for abdominal pain, blood in stool and constipation.       No gerd  Genitourinary:  Negative for dysuria.  Musculoskeletal:  Negative for arthralgias and back pain.       CTS  Skin:  Negative for rash.  Neurological:  Negative for light-headedness and headaches.  Psychiatric/Behavioral:  Negative for dysphoric mood. The patient is nervous/anxious.        Objective:   Vitals:   12/04/22 0850  BP: 122/78  Pulse: 68  Temp: 98.9 F (37.2 C)  SpO2:  93%   Filed Weights   12/04/22 0850  Weight: 181 lb (82.1 kg)   Body mass index is 28.35 kg/m.  BP Readings from Last 3 Encounters:  12/04/22 122/78  11/28/21 114/68  08/20/21 122/80    Wt Readings from Last 3 Encounters:  12/04/22 181 lb (82.1 kg)  11/28/21 180 lb (81.6 kg)  08/20/21 184 lb (83.5 kg)       Physical Exam Constitutional: She appears well-developed and well-nourished. No distress.  HENT:  Head: Normocephalic and atraumatic.  Right Ear: External ear normal. Normal ear canal and TM Left Ear: External ear normal.  Normal ear canal and TM Mouth/Throat: Oropharynx is clear and moist.  Eyes: Conjunctivae normal.  Neck: Neck supple. No tracheal deviation present. No thyromegaly present.  No carotid bruit  Cardiovascular: Normal rate, regular rhythm and normal heart sounds.   No murmur heard.  No edema. Pulmonary/Chest: Effort normal and breath sounds normal. No respiratory distress. She has no wheezes. She has no rales.  Breast: deferred   Abdominal: Soft. She exhibits no distension. There is no tenderness.  Lymphadenopathy: She has no cervical adenopathy.  Skin: Skin is warm and dry. She is not diaphoretic.  Psychiatric: She has a normal mood and affect. Her behavior is normal.     Lab Results  Component Value  Date   WBC 5.1 12/02/2022   HGB 14.8 12/02/2022   HCT 43.9 12/02/2022   PLT 194.0 12/02/2022   GLUCOSE 96 12/02/2022   CHOL 205 (H) 12/02/2022   TRIG 113.0 12/02/2022   HDL 51.20 12/02/2022   LDLCALC 131 (H) 12/02/2022   ALT 14 12/02/2022   AST 21 12/02/2022   NA 139 12/02/2022   K 3.7 12/02/2022   CL 100 12/02/2022   CREATININE 0.79 12/02/2022   BUN 15 12/02/2022   CO2 32 12/02/2022   TSH 1.16 12/02/2022   HGBA1C 5.7 12/02/2022    The 10-year ASCVD risk score (Arnett DK, et al., 2019) is: 9.9%   Values used to calculate the score:     Age: 6 years     Sex: Female     Is Non-Hispanic African American: No     Diabetic: Yes      Tobacco smoker: No     Systolic Blood Pressure: 122 mmHg     Is BP treated: Yes     HDL Cholesterol: 51.2 mg/dL     Total Cholesterol: 205 mg/dL      Assessment & Plan:   Physical exam: Screening blood work  ordered Exercise  none-stressed the importance of making time exercise Weight  is ok Substance abuse  none   Reviewed recommended immunizations.   Health Maintenance  Topic Date Due   Colonoscopy  08/04/2022   COVID-19 Vaccine (3 - 2023-24 season) 12/20/2022 (Originally 10/11/2022)   MAMMOGRAM  04/21/2024   Cervical Cancer Screening (HPV/Pap Cotest)  04/23/2025   DTaP/Tdap/Td (3 - Td or Tdap) 11/07/2028   INFLUENZA VACCINE  Completed   Hepatitis C Screening  Completed   HIV Screening  Completed   Zoster Vaccines- Shingrix  Completed   HPV VACCINES  Aged Out      She will call to set up her colonoscopy.    See Problem List for Assessment and Plan of chronic medical problems.

## 2022-12-03 NOTE — Patient Instructions (Addendum)
Call to schedule colonoscopy - Phone: [phone] Dr Lavon Paganini.       Medications changes include :   None      Return in about 1 year (around 12/04/2023) for Physical Exam.    Health Maintenance, Female Adopting a healthy lifestyle and getting preventive care are important in promoting health and wellness. Ask your health care provider about: The right schedule for you to have regular tests and exams. Things you can do on your own to prevent diseases and keep yourself healthy. What should I know about diet, weight, and exercise? Eat a healthy diet  Eat a diet that includes plenty of vegetables, fruits, low-fat dairy products, and lean protein. Do not eat a lot of foods that are high in solid fats, added sugars, or sodium. Maintain a healthy weight Body mass index (BMI) is used to identify weight problems. It estimates body fat based on height and weight. Your health care provider can help determine your BMI and help you achieve or maintain a healthy weight. Get regular exercise Get regular exercise. This is one of the most important things you can do for your health. Most adults should: Exercise for at least 150 minutes each week. The exercise should increase your heart rate and make you sweat (moderate-intensity exercise). Do strengthening exercises at least twice a week. This is in addition to the moderate-intensity exercise. Spend less time sitting. Even light physical activity can be beneficial. Watch cholesterol and blood lipids Have your blood tested for lipids and cholesterol at 62 years of age, then have this test every 5 years. Have your cholesterol levels checked more often if: Your lipid or cholesterol levels are high. You are older than 63 years of age. You are at high risk for heart disease. What should I know about cancer screening? Depending on your health history and family history, you may need to have cancer screening at various ages. This may include  screening for: Breast cancer. Cervical cancer. Colorectal cancer. Skin cancer. Lung cancer. What should I know about heart disease, diabetes, and high blood pressure? Blood pressure and heart disease High blood pressure causes heart disease and increases the risk of stroke. This is more likely to develop in people who have high blood pressure readings or are overweight. Have your blood pressure checked: Every 3-5 years if you are 57-25 years of age. Every year if you are 42 years old or older. Diabetes Have regular diabetes screenings. This checks your fasting blood sugar level. Have the screening done: Once every three years after age 56 if you are at a normal weight and have a low risk for diabetes. More often and at a younger age if you are overweight or have a high risk for diabetes. What should I know about preventing infection? Hepatitis B If you have a higher risk for hepatitis B, you should be screened for this virus. Talk with your health care provider to find out if you are at risk for hepatitis B infection. Hepatitis C Testing is recommended for: Everyone born from [birth year] through 1965. Anyone with known risk factors for hepatitis C. Sexually transmitted infections (STIs) Get screened for STIs, including gonorrhea and chlamydia, if: You are sexually active and are younger than 63 years of age. You are older than 63 years of age and your health care provider tells you that you are at risk for this type of infection. Your sexual activity has changed since you were last screened, and you are at increased  risk for chlamydia or gonorrhea. Ask your health care provider if you are at risk. Ask your health care provider about whether you are at high risk for HIV. Your health care provider may recommend a prescription medicine to help prevent HIV infection. If you choose to take medicine to prevent HIV, you should first get tested for HIV. You should then be tested every 3 months for as  long as you are taking the medicine. Pregnancy If you are about to stop having your period (premenopausal) and you may become pregnant, seek counseling before you get pregnant. Take 400 to 800 micrograms (mcg) of folic acid every day if you become pregnant. Ask for birth control (contraception) if you want to prevent pregnancy. Osteoporosis and menopause Osteoporosis is a disease in which the bones lose minerals and strength with aging. This can result in bone fractures. If you are 96 years old or older, or if you are at risk for osteoporosis and fractures, ask your health care provider if you should: Be screened for bone loss. Take a calcium or vitamin D supplement to lower your risk of fractures. Be given hormone replacement therapy (HRT) to treat symptoms of menopause. Follow these instructions at home: Alcohol use Do not drink alcohol if: Your health care provider tells you not to drink. You are pregnant, may be pregnant, or are planning to become pregnant. If you drink alcohol: Limit how much you have to: 0-1 drink a day. Know how much alcohol is in your drink. In the [place] one drink equals one 12 oz bottle of beer (355 mL), one 5 oz glass of wine (148 mL), or one 1 oz glass of hard liquor (44 mL). Lifestyle Do not use any products that contain nicotine or tobacco. These products include cigarettes, chewing tobacco, and vaping devices, such as e-cigarettes. If you need help quitting, ask your health care provider. Do not use street drugs. Do not share needles. Ask your health care provider for help if you need support or information about quitting drugs. General instructions Schedule regular health, dental, and eye exams. Stay current with your vaccines. Tell your health care provider if: You often feel depressed. You have ever been abused or do not feel safe at home. Summary Adopting a healthy lifestyle and getting preventive care are important in promoting health and  wellness. Follow your health care provider's instructions about healthy diet, exercising, and getting tested or screened for diseases. Follow your health care provider's instructions on monitoring your cholesterol and blood pressure. This information is not intended to replace advice given to you by your health care provider. Make sure you discuss any questions you have with your health care provider. Document Revised: 06/17/2020 Document Reviewed: 06/17/2020 Elsevier Patient Education  2024 [employer].

## 2022-12-04 ENCOUNTER — Ambulatory Visit (INDEPENDENT_AMBULATORY_CARE_PROVIDER_SITE_OTHER): Payer: BC Managed Care – PPO | Admitting: Internal Medicine

## 2022-12-04 VITALS — BP 122/78 | HR 68 | Temp 98.9°F | Ht 67.0 in | Wt 181.0 lb

## 2022-12-04 DIAGNOSIS — R739 Hyperglycemia, unspecified: Secondary | ICD-10-CM | POA: Diagnosis not present

## 2022-12-04 DIAGNOSIS — F419 Anxiety disorder, unspecified: Secondary | ICD-10-CM

## 2022-12-04 DIAGNOSIS — Z0000 Encounter for general adult medical examination without abnormal findings: Secondary | ICD-10-CM | POA: Diagnosis not present

## 2022-12-04 DIAGNOSIS — I10 Essential (primary) hypertension: Secondary | ICD-10-CM | POA: Diagnosis not present

## 2022-12-04 DIAGNOSIS — E782 Mixed hyperlipidemia: Secondary | ICD-10-CM | POA: Diagnosis not present

## 2022-12-04 DIAGNOSIS — E611 Iron deficiency: Secondary | ICD-10-CM

## 2022-12-04 NOTE — Assessment & Plan Note (Signed)
Chronic Blood pressure well controlled CMP reviewed Continue amlodipine 5 mg daily, losartan 100 mg daily

## 2022-12-04 NOTE — Assessment & Plan Note (Addendum)
Chronic Controlled, Stable Taking magnesium glycinate Not currently exercising which usually helps-stressed making the time exercise Medication as needed in the past has not been that helpful

## 2022-12-04 NOTE — Assessment & Plan Note (Signed)
Chronic Regular exercise and healthy diet encouraged Check lipid panel Mildly elevated ASCVD at 9.9% Continue lifestyle control  Lab Results  Component Value Date   LDLCALC 131 (H) 12/02/2022

## 2022-12-04 NOTE — Assessment & Plan Note (Signed)
Chronic Likely secondary to being a vegetarian Taking iron supplementation Iron levels normal, no anemia

## 2022-12-04 NOTE — Assessment & Plan Note (Signed)
Chronic Lab Results  Component Value Date   HGBA1C 5.7 12/02/2022   Low sugar / carb diet Stressed regular exercise

## 2022-12-29 ENCOUNTER — Encounter: Payer: Self-pay | Admitting: Internal Medicine

## 2023-01-31 ENCOUNTER — Other Ambulatory Visit: Payer: Self-pay | Admitting: Internal Medicine

## 2023-01-31 DIAGNOSIS — J309 Allergic rhinitis, unspecified: Secondary | ICD-10-CM

## 2023-03-08 ENCOUNTER — Ambulatory Visit (AMBULATORY_SURGERY_CENTER): Payer: Self-pay

## 2023-03-08 ENCOUNTER — Other Ambulatory Visit: Payer: Self-pay

## 2023-03-08 VITALS — Ht 66.5 in | Wt 175.0 lb

## 2023-03-08 DIAGNOSIS — Z1211 Encounter for screening for malignant neoplasm of colon: Secondary | ICD-10-CM

## 2023-03-08 MED ORDER — SUFLAVE 178.7 G PO SOLR: 1.0000 | ORAL | 0 refills | Status: DC

## 2023-03-08 NOTE — Progress Notes (Signed)
Denies allergies to eggs or soy products. Denies complication of anesthesia or sedation. Denies use of weight loss medication. Denies use of O2.   Emmi instructions given for colonoscopy.

## 2023-03-15 ENCOUNTER — Telehealth: Payer: Self-pay | Admitting: Internal Medicine

## 2023-03-15 NOTE — Telephone Encounter (Signed)
Inbound call from patient, inquiring if she could drink a vegan protein shake on the day prior to procedure. Patient states she would get sick and is unsure if she can go two days without eating anything. She was advised per nurse that she had to stick with the liquid diet given to her on his instructions. Patient requested to speak to a nurse.

## 2023-03-15 NOTE — Telephone Encounter (Signed)
Returned call, patient asking if she can eat or have protein shakes the day before the procedure.  RN informed patient that only clear liquids are allowed the day before the procedure.  She verbalized understanding.

## 2023-03-16 ENCOUNTER — Encounter: Payer: Self-pay | Admitting: Certified Registered Nurse Anesthetist

## 2023-03-22 ENCOUNTER — Encounter: Payer: Self-pay | Admitting: Internal Medicine

## 2023-03-23 ENCOUNTER — Ambulatory Visit: Payer: 59 | Admitting: Internal Medicine

## 2023-03-23 ENCOUNTER — Encounter: Payer: Self-pay | Admitting: Internal Medicine

## 2023-03-23 VITALS — BP 130/78 | HR 65 | Temp 98.4°F | Resp 14 | Ht 66.5 in | Wt 175.0 lb

## 2023-03-23 DIAGNOSIS — D122 Benign neoplasm of ascending colon: Secondary | ICD-10-CM | POA: Diagnosis not present

## 2023-03-23 DIAGNOSIS — K648 Other hemorrhoids: Secondary | ICD-10-CM

## 2023-03-23 DIAGNOSIS — Z1211 Encounter for screening for malignant neoplasm of colon: Secondary | ICD-10-CM

## 2023-03-23 DIAGNOSIS — K635 Polyp of colon: Secondary | ICD-10-CM | POA: Diagnosis not present

## 2023-03-23 DIAGNOSIS — D123 Benign neoplasm of transverse colon: Secondary | ICD-10-CM

## 2023-03-23 MED ORDER — SODIUM CHLORIDE 0.9 % IV SOLN: 500.0000 mL | Freq: Once | INTRAVENOUS | Status: DC

## 2023-03-23 NOTE — Progress Notes (Signed)
Called to room to assist during endoscopic procedure.  Patient ID and intended procedure confirmed with present staff. Received instructions for my participation in the procedure from the performing physician.

## 2023-03-23 NOTE — Progress Notes (Signed)
GASTROENTEROLOGY PROCEDURE H&P NOTE   Primary Care Physician: Pincus Sanes, MD    Reason for Procedure:   Colon cancer screening  Plan:    Colonoscopy  Patient is appropriate for endoscopic procedure(s) in the ambulatory ([facility]) setting.  The nature of the procedure, as well as the risks, benefits, and alternatives were carefully and thoroughly reviewed with the patient. Ample time for discussion and questions allowed. The patient understood, was satisfied, and agreed to proceed.     HPI: Kathleen Weber is a 63 y.o. female who presents for colonoscopy for colon cancer screening. Denies blood in stools, changes in bowel habits, or unintentional weight loss. Denies family history of colon cancer.  Last colonoscopy in 2014 was normal.  Past Medical History:  Diagnosis Date   Allergy    Arthritis    Chicken pox    Gastritis    GERD (gastroesophageal reflux disease)    Hypertension    Neuropathy    Thyroid nodule     Past Surgical History:  Procedure Laterality Date   neg hx      Prior to Admission medications   Medication Sig Start Date End Date Taking? Authorizing Provider  amLODipine (NORVASC) 5 MG tablet TAKE 1 TABLET BY MOUTH EVERYDAY AT BEDTIME 08/10/22  Yes Burns, Bobette Mo, MD  losartan (COZAAR) 100 MG tablet TAKE 1 TABLET BY MOUTH EVERY DAY 10/22/22  Yes Burns, Bobette Mo, MD  VITAMIN D PO Take 1 capsule by mouth daily.   Yes [provider]  aspirin EC 81 MG tablet Take 81 mg by mouth daily. Swallow whole.    [provider]  Biotin 5 MG CAPS Take by mouth. Take 3 capsule daily    [provider]  Ferrous Sulfate (IRON) 325 (65 FE) MG TABS Take 1 tablet by mouth daily.    [provider]  fluticasone (FLONASE) 50 MCG/ACT nasal spray SPRAY 1 SPRAY INTO BOTH NOSTRILS DAILY. 02/01/23   Pincus Sanes, MD  OVER THE COUNTER MEDICATION B Complex vitamin. Once daily.    [provider]    Current Outpatient Medications   Medication Sig Dispense Refill   amLODipine (NORVASC) 5 MG tablet TAKE 1 TABLET BY MOUTH EVERYDAY AT BEDTIME 90 tablet 3   losartan (COZAAR) 100 MG tablet TAKE 1 TABLET BY MOUTH EVERY DAY 90 tablet 1   VITAMIN D PO Take 1 capsule by mouth daily.     aspirin EC 81 MG tablet Take 81 mg by mouth daily. Swallow whole.     Biotin 5 MG CAPS Take by mouth. Take 3 capsule daily     Ferrous Sulfate (IRON) 325 (65 FE) MG TABS Take 1 tablet by mouth daily.     fluticasone (FLONASE) 50 MCG/ACT nasal spray SPRAY 1 SPRAY INTO BOTH NOSTRILS DAILY. 48 mL 5   OVER THE COUNTER MEDICATION B Complex vitamin. Once daily.     Current Facility-Administered Medications  Medication Dose Route Frequency Provider Last Rate Last Admin   0.9 %  sodium chloride infusion  500 mL Intravenous Once Imogene Burn, MD        Allergies as of 03/23/2023 - Review Complete 03/23/2023  Allergen Reaction Noted   Bee pollen Other (See Comments) 05/26/2012   Codeine Other (See Comments) 05/26/2012   Pollen extract Other (See Comments) 05/26/2012    Family History  Problem Relation Age of Onset   Stomach cancer Mother    Heart disease Mother    Kidney disease Mother  kidney cancer   Hypertension Mother    Diabetes Father    Coronary artery disease Father 49       CABGx5   Stroke Father        TIAs   Hypertension Father    Lymphoma Maternal Grandmother    Colon cancer Neg Hx    Esophageal cancer Neg Hx    Rectal cancer Neg Hx     Social History   Socioeconomic History   Marital status: Married    Spouse name: Not on file   Number of children: 2   Years of education: Not on file   Highest education level: Not on file  Occupational History   Occupation: Magazine features editor: [employer]  Tobacco Use   Smoking status: Never   Smokeless tobacco: Never  Substance and Sexual Activity   Alcohol use: No   Drug use: No   Sexual activity: Not on file  Other Topics Concern   Not on file   Social History Narrative   Not on file   Social Drivers of Health   Financial Resource Strain: Not on file  Food Insecurity: No Food Insecurity (11/26/2021)   Received from [facility], [facility]   Hunger Vital Sign    Worried About Running Out of Food in the Last Year: Never true    Ran Out of Food in the Last Year: Never true  Transportation Needs: Not on file  Physical Activity: Not on file  Stress: Not on file  Social Connections: Unknown (06/23/2021)   Received from [facility], [facility]   Social Network    Social Network: Not on file  Intimate Partner Violence: Unknown (05/15/2021)   Received from [facility], [facility]   HITS    Physically Hurt: Not on file    Insult or Talk Down To: Not on file    Threaten Physical Harm: Not on file    Scream or Curse: Not on file    Physical Exam: Vital signs in last 24 hours: BP 122/76   Pulse 88   Temp 98.4 F (36.9 C)   Ht 5' 6.5" (1.689 m)   Wt 175 lb (79.4 kg)   LMP 06/19/2014 Comment: currently irregular  SpO2 95%   BMI 27.82 kg/m  GEN: NAD EYE: Sclerae anicteric ENT: MMM CV: Non-tachycardic Pulm: No increased work of breathing GI: Soft, NT/ND NEURO:  Alert & Oriented   [name], MD [facility]  03/23/2023 10:44 AM

## 2023-03-23 NOTE — Op Note (Signed)
[facility] Patient Name: Kathleen Weber Procedure Date: 03/23/2023 10:48 AM MRN: [id number] Endoscopist: Madelyn Brunner Murdock , , [id number] Age: 63 Referring MD:  Date of Birth: November 02, 1960 Gender: Female Account #: [id number] Procedure:                Colonoscopy Indications:              Screening for colorectal malignant neoplasm Medicines:                Monitored Anesthesia Care Procedure:                Pre-Anesthesia Assessment:                           - Prior to the procedure, a History and Physical                            was performed, and patient medications and                            allergies were reviewed. The patient's tolerance of                            previous anesthesia was also reviewed. The risks                            and benefits of the procedure and the sedation                            options and risks were discussed with the patient.                            All questions were answered, and informed consent                            was obtained. Prior Anticoagulants: The patient has                            taken no anticoagulant or antiplatelet agents. ASA                            Grade Assessment: II - A patient with mild systemic                            disease. After reviewing the risks and benefits,                            the patient was deemed in satisfactory condition to                            undergo the procedure.                           After obtaining informed consent, the colonoscope  was passed under direct vision. Throughout the                            procedure, the patient's blood pressure, pulse, and                            oxygen saturations were monitored continuously. The                            CF [id number] #[id number] was introduced through the anus                            and advanced to the the terminal ileum. The                             colonoscopy was performed without difficulty. The                            patient tolerated the procedure well. The quality                            of the bowel preparation was excellent. The                            terminal ileum, ileocecal valve, appendiceal                            orifice, and rectum were photographed. Scope In: 10:52:54 AM Scope Out: 11:13:23 AM Scope Withdrawal Time: 0 hours 15 minutes 33 seconds  Total Procedure Duration: 0 hours 20 minutes 29 seconds  Findings:                 The terminal ileum appeared normal.                           Four sessile polyps were found in the transverse                            colon and ascending colon. The polyps were 3 to 6                            mm in size. These polyps were removed with a cold                            snare. Resection and retrieval were complete.                           Non-bleeding internal hemorrhoids were found during                            retroflexion. Complications:            No immediate complications. Estimated Blood Loss:     Estimated blood loss was minimal. Impression:               -  The examined portion of the ileum was normal.                           - Four 3 to 6 mm polyps in the transverse colon and                            in the ascending colon, removed with a cold snare.                            Resected and retrieved.                           - Non-bleeding internal hemorrhoids. Recommendation:           - Discharge patient to home (with escort).                           - Await pathology results.                           - The findings and recommendations were discussed                            with the patient. Dr Particia Lather "Alan Ripper" Leonides Schanz,  03/23/2023 11:18:01 AM

## 2023-03-23 NOTE — Patient Instructions (Signed)
Resume previous diet Continue present medications Await pathology results Handouts/information given for polyps and hemorrhoids  YOU HAD AN ENDOSCOPIC PROCEDURE TODAY AT THE [facility]:   Refer to the procedure report that was given to you for any specific questions about what was found during the examination.  If the procedure report does not answer your questions, please call your gastroenterologist to clarify.  If you requested that your care partner not be given the details of your procedure findings, then the procedure report has been included in a sealed envelope for you to review at your convenience later.  YOU SHOULD EXPECT: Some feelings of bloating in the abdomen. Passage of more gas than usual.  Walking can help get rid of the air that was put into your GI tract during the procedure and reduce the bloating. If you had a lower endoscopy (such as a colonoscopy or flexible sigmoidoscopy) you may notice spotting of blood in your stool or on the toilet paper. If you underwent a bowel prep for your procedure, you may not have a normal bowel movement for a few days.  Please Note:  You might notice some irritation and congestion in your nose or some drainage.  This is from the oxygen used during your procedure.  There is no need for concern and it should clear up in a day or so.  SYMPTOMS TO REPORT IMMEDIATELY:  Following lower endoscopy (colonoscopy):  Excessive amounts of blood in the stool  Significant tenderness or worsening of abdominal pains  Swelling of the abdomen that is new, acute  Fever of 100F or higher  For urgent or emergent issues, a gastroenterologist can be reached at any hour by calling [phone]. Do not use MyChart messaging for urgent concerns.   DIET:  We do recommend a small meal at first, but then you may proceed to your regular diet.  Drink plenty of fluids but you should avoid alcoholic beverages for 24 hours.  ACTIVITY:  You should plan to take  it easy for the rest of today and you should NOT DRIVE or use heavy machinery until tomorrow (because of the sedation medicines used during the test).    FOLLOW UP: Our staff will call the number listed on your records the next business day following your procedure.  We will call around 7:15- 8:00 am to check on you and address any questions or concerns that you may have regarding the information given to you following your procedure. If we do not reach you, we will leave a message.     If any biopsies were taken you will be contacted by phone or by letter within the next 1-3 weeks.  Please call us at [phone] if you have not heard about the biopsies in 3 weeks.    SIGNATURES/CONFIDENTIALITY: You and/or your care partner have signed paperwork which will be entered into your electronic medical record.  These signatures attest to the fact that that the information above on your After Visit Summary has been reviewed and is understood.  Full responsibility of the confidentiality of this discharge information lies with you and/or your care-partner.

## 2023-03-23 NOTE — Progress Notes (Signed)
Report given to PACU, vss

## 2023-03-24 ENCOUNTER — Telehealth: Payer: Self-pay | Admitting: *Deleted

## 2023-03-24 NOTE — Telephone Encounter (Signed)
Attempted post procedure follow up call.  No answer - LVM.

## 2023-03-26 ENCOUNTER — Telehealth: Payer: Self-pay | Admitting: Internal Medicine

## 2023-03-26 LAB — SURGICAL PATHOLOGY

## 2023-03-26 NOTE — Telephone Encounter (Signed)
Inbound call from patient stating she got her results back on Mychart from her colonoscopy. Patient is requesting that her results be reviewed and a call to discuss recommendations. Please advise.

## 2023-03-29 ENCOUNTER — Encounter: Payer: Self-pay | Admitting: Internal Medicine

## 2023-03-29 NOTE — Telephone Encounter (Signed)
Spoke with the patient. Advised she will receive a phone call or a letter once her results have been reviewed by her provider.

## 2023-04-08 ENCOUNTER — Encounter: Payer: Self-pay | Admitting: Internal Medicine

## 2023-04-09 MED ORDER — TRETINOIN 0.05 % EX CREA: TOPICAL_CREAM | Freq: Every day | CUTANEOUS | 1 refills | Status: DC

## 2023-04-12 ENCOUNTER — Other Ambulatory Visit (HOSPITAL_COMMUNITY): Payer: Self-pay

## 2023-04-12 ENCOUNTER — Telehealth: Payer: Self-pay

## 2023-04-12 NOTE — Telephone Encounter (Signed)
 Noted, please try anyway as we have notified pt and if denied she may need appt.

## 2023-04-12 NOTE — Telephone Encounter (Signed)
 Typically a prior auth has to have chart notes uploaded in order for the insurance to make an accurate decision. I see no chart notes or dx code pertaining to Tretinoin 0.05% cream. I can try and submit the prior auth with no chart notes but be aware that it will probably get denied.

## 2023-04-13 ENCOUNTER — Other Ambulatory Visit (HOSPITAL_COMMUNITY): Payer: Self-pay

## 2023-04-13 NOTE — Telephone Encounter (Signed)
 Pharmacy Patient Advocate Encounter   A prior authorization for TRETINION is required/requested.    Per test claim: PA required; PA submitted to above mentioned insurance via CoverMyMeds Key/confirmation #/EOC [id number] Status is pending    PLEASE BE ADVISED

## 2023-04-15 NOTE — Telephone Encounter (Signed)
 Pharmacy Patient Advocate Encounter  Received notification from [facility] that Prior Authorization for Tretinoin 0.05% cream has been APPROVED from 04/12/23 to 08/12/23   PA #/Case ID/Reference #: [id number]  Approval letter indexed to Media tab

## 2023-04-15 NOTE — Telephone Encounter (Signed)
 Advised pt of approval via mychart

## 2023-05-01 ENCOUNTER — Other Ambulatory Visit: Payer: Self-pay | Admitting: Internal Medicine

## 2023-07-14 ENCOUNTER — Telehealth: Payer: Self-pay

## 2023-07-14 ENCOUNTER — Other Ambulatory Visit (HOSPITAL_COMMUNITY): Payer: Self-pay

## 2023-07-14 NOTE — Telephone Encounter (Signed)
 Pharmacy Patient Advocate Encounter   Received notification from CoverMyMeds that prior authorization for Retin A 0.05% cream is required/requested.   Insurance verification completed.   The patient is insured through [facility] . Current prior authorization expires 08/13/23.   Per test claim: The current 30 day co-pay is, $5.00.  No PA needed at this time. This test claim was processed through [facility]- copay amounts may vary at other pharmacies due to pharmacy/plan contracts, or as the patient moves through the different stages of their insurance plan.

## 2023-10-13 ENCOUNTER — Other Ambulatory Visit (HOSPITAL_COMMUNITY): Payer: Self-pay

## 2023-10-13 ENCOUNTER — Other Ambulatory Visit: Payer: Self-pay | Admitting: Internal Medicine

## 2023-10-13 ENCOUNTER — Telehealth: Payer: Self-pay

## 2023-10-13 NOTE — Telephone Encounter (Signed)
 Pharmacy Patient Advocate Encounter   Received notification from CoverMyMeds that prior authorization for Tretinoin  0.05% cream  IS DUE FOR RENEWAL. PA EXPIRED 08/13/2023   Insurance verification completed.   The patient is insured through [facility].  Action: PA required; PA submitted to above mentioned insurance via Latent Key/confirmation #/EOC [id number] Status is pending

## 2023-10-14 ENCOUNTER — Other Ambulatory Visit (HOSPITAL_COMMUNITY): Payer: Self-pay

## 2023-10-18 NOTE — Telephone Encounter (Signed)
 Pharmacy Patient Advocate Encounter  Received notification from [facility] that Prior Authorization for Tretinoin  0.05% cream  has been DENIED.  See denial reason below. No denial letter attached in CMM. Will attach denial letter to Media tab once received.    PA #/Case ID/Reference #: [id number]

## 2023-10-18 NOTE — Telephone Encounter (Signed)
 Let patient know.  She may be able to get this through GoodRx or a different website cheaper without insurance.

## 2023-10-26 ENCOUNTER — Other Ambulatory Visit: Payer: Self-pay | Admitting: Internal Medicine

## 2023-11-01 ENCOUNTER — Ambulatory Visit: Payer: Self-pay

## 2023-11-01 NOTE — Telephone Encounter (Signed)
 Appointment made for 12/2 @ 910 am (right before spouse appointment)

## 2023-11-01 NOTE — Telephone Encounter (Signed)
 FYI Only or Action Required?: Action required by provider: request for appointment.  Patient was last seen in primary care on 12/04/2022 by Geofm Glade PARAS, MD.  Called Nurse Triage reporting Appointment.  Symptoms began no triage.  Interventions attempted: Other: no triage.  Symptoms are: no triage .  Triage Disposition: See PCP Within 2 Weeks  Patient/caregiver understands and will follow disposition?: No, refuses disposition    Reason for Disposition  Requesting regular office appointment  Answer Assessment - Initial Assessment Questions 1. REASON FOR CALL: What is the main reason for your call? or How can I best help you?   Patient spouse called in for themselves, when reviewing appointments Kathleen Weber joined the call to ask which date herself and spouse are scheduled for annual physical with Dr. Geofm.  Confirmed spouse appoint on Jan 10 929 with pcp but Patient Kathleen Weber does not have appointment scheduled. Offered next available in January but Ashlinn declines this offer and would like to be scheduled with her spouse on Dec 2. She shares she is confused on why his was scheduled and not hers as they always scheduled them together. Requesting pcp appointment work in on Dec 2. Around 930. Please advise.  Protocols used: Information Only Call - No Triage-A-AH

## 2023-11-02 ENCOUNTER — Telehealth: Payer: Self-pay

## 2023-11-02 ENCOUNTER — Other Ambulatory Visit: Payer: Self-pay

## 2023-11-02 DIAGNOSIS — R739 Hyperglycemia, unspecified: Secondary | ICD-10-CM

## 2023-11-02 DIAGNOSIS — Z0000 Encounter for general adult medical examination without abnormal findings: Secondary | ICD-10-CM

## 2023-11-02 DIAGNOSIS — I10 Essential (primary) hypertension: Secondary | ICD-10-CM

## 2023-11-02 DIAGNOSIS — E782 Mixed hyperlipidemia: Secondary | ICD-10-CM

## 2023-11-02 DIAGNOSIS — F419 Anxiety disorder, unspecified: Secondary | ICD-10-CM

## 2023-11-02 DIAGNOSIS — E611 Iron deficiency: Secondary | ICD-10-CM

## 2023-11-03 NOTE — Telephone Encounter (Signed)
 There are no specific labs for kidney cancer or stomach cancer.  If she is talking about tumor markers which are used for monitoring certain cancers that is not something that we order for screening tests and I am not specific.  If she is referring to any genetic testing-I could refer her to a Dentist and they can see if it is covered by her insurance and order the necessary tests.

## 2023-11-18 ENCOUNTER — Ambulatory Visit: Payer: Self-pay

## 2023-11-18 NOTE — Telephone Encounter (Signed)
 FYI Only or Action Required?: Action required by provider: medication refill request.  Pt requesting paxlovid.   Patient was last seen in primary care on 12/04/2022 by Geofm Glade PARAS, MD.  Called Nurse Triage reporting Covid Exposure.  Symptoms began yesterday.  Interventions attempted: Rest, hydration, or home remedies.  Symptoms are: unchanged.  Triage Disposition: Call PCP Within 24 Hours  Patient/caregiver understands and will follow disposition?: Yes, will follow disposition  Copied from CRM #[id number]. Topic: Clinical - Red Word Triage >> Nov 18, 2023  4:16 PM Drema MATSU wrote: Reason for CRM: Husband Care giver tested positive for COVID. Patient is requesting Paxlovid  Symptoms: headache, very fatigue Reason for Disposition  [1] Patient is NOT HIGH RISK AND [2] strongly requests antiviral medicine AND [3] COVID-19 symptoms present < 5 days  Answer Assessment - Initial Assessment Questions 1. COVID-19 EXPOSURE: Please describe how you were exposed to someone with a COVID-19 infection.     Care giver that comes to home 2. PLACE of CONTACT: Where were you when you were exposed to COVID-19? (e.g., home, school, medical waiting room; which city?)     home 3. TYPE of CONTACT: How much contact was there? (e.g., sitting next to, live in same house, work in same office, same building)     In same building 4. DURATION of CONTACT: How long were you in contact with the COVID-19 patient? (e.g., a few seconds, passed by person, a few minutes, 15 minutes or longer, live with the patient)     2 hours each day 5. DATE of CONTACT: When did you have contact with a COVID-19 patient? (e.g., hours, days ago)     2 days ago, caregiver was not symptomatic 6. MASK: Were you wearing a mask? Was the other person wearing a mask? Note: wearing a mask reduces the risk of an otherwise close contact.     denies 7. SYMPTOMS: Do you have any symptoms? (e.g., fever, cough, breathing difficulty,  loss of taste or smell)     Headaches, weakness, nausea, denies fever 8. COVID-19 VACCINE: Have you had the COVID-19 vaccine? If Yes, ask: When did you last get it?     Has not had covid vaccine this year 10. HIGH RISK: Do you have any heart or lung problems? (e.g., asthma, COPD, heart failure) Do you have a weak immune system or other risk factors? (e.g., HIV positive, chemotherapy, renal failure, diabetes mellitus, sickle cell anemia, obesity)       Denies  Pt refuses OV, only willing to do VV. Advised to do at home covid test. Pt requesting antiviral.  Protocols used: COVID-19 - Exposure-A-AH, COVID-19 - Diagnosed or Suspected-A-AH

## 2023-11-19 ENCOUNTER — Telehealth: Admitting: Nurse Practitioner

## 2023-11-19 DIAGNOSIS — U071 COVID-19: Secondary | ICD-10-CM | POA: Diagnosis not present

## 2023-11-19 DIAGNOSIS — I10 Essential (primary) hypertension: Secondary | ICD-10-CM

## 2023-11-19 MED ORDER — NIRMATRELVIR/RITONAVIR (PAXLOVID)TABLET: 3.0000 | ORAL_TABLET | Freq: Two times a day (BID) | ORAL | 0 refills | Status: AC

## 2023-11-19 MED ORDER — PROMETHAZINE-DM 6.25-15 MG/5ML PO SYRP: 5.0000 mL | ORAL_SOLUTION | Freq: Four times a day (QID) | ORAL | 0 refills | Status: DC | PRN

## 2023-11-19 NOTE — Progress Notes (Signed)
 Established Patient Office Visit  An audio/visual tele-health visit was completed today for this patient. I connected with  Abbegale Heidecker on 11/19/23 utilizing audio/visual technology and verified that I am speaking with the correct person using two identifiers. The patient was located at their home, and I was located at the office of [facility] at [facility] during the encounter. I discussed the limitations of evaluation and management by telemedicine. The patient expressed understanding and agreed to proceed.     Subjective   Patient ID: Kathleen Weber, female    DOB: 1960/11/10  Age: 63 y.o. MRN: [id number]  Chief Complaint  Patient presents with   Covid Exposure    Was exposed to is since Tuesday, headache, fatigue, sinus issue.    Discussed the use of AI scribe software for clinical note transcription with the patient, who gave verbal consent to proceed.  History of Present Illness Kathleen Weber is a 63 year old female who presents with symptoms following a known COVID-19 exposure.  Upper respiratory and systemic symptoms - Onset of symptoms yesterday following known COVID-19 exposure from 3 days ago - Symptoms include persistent headache, fatigue, congestion, mild cough, and intermittent nausea - No shortness of breath, fever, diarrhea, or stomach pain - Symptoms are similar to previous COVID-19 infection in December 2023, which began with malaise and progressed to more severe illness - Currently using Tylenol for symptom management, which alleviates headache and body aches  Covid-19 vaccination status - Vaccinated against COVID-19 - Has not received the latest booster this fall  Hypertension and medication tolerance - Hypertension managed with amlodipine  at night and losartan  in the morning - Experienced dizziness and confusion at higher doses of amlodipine   Nasal congestion and allergic rhinitis management - Uses Flonase  for nasal symptoms but  has not been using it recently      Review of Systems  Constitutional:  Positive for malaise/fatigue. Negative for fever.  Respiratory:  Positive for cough. Negative for shortness of breath.   Gastrointestinal:  Positive for nausea. Negative for abdominal pain and diarrhea.  Neurological:  Positive for headaches.      Objective:     LMP 06/19/2014 Comment: currently irregular   Physical Exam Comprehensive physical exam not completed today as office visit was conducted remotely.  No acute distress noted.  Patient was alert and oriented, and appeared to have appropriate judgment.   No results found for any visits on 11/19/23.    The 10-year ASCVD risk score (Arnett DK, et al., 2019) is: 5.9%    Assessment & Plan:   Problem List Items Addressed This Visit       Other   COVID - Primary   Suspected COVID-19 infection Recent COVID exposure with symptoms suggestive of COVID-19. Likely COVID-19 given exposure and symptom onset. - Send prescription for Paxlovid to [facility]. - Advise to start Paxlovid within five days of symptom onset. - Recommend testing for COVID-19 before starting Paxlovid, if possible. - Continue Tylenol for symptom management. - Encourage hydration and light activity at home. - Monitor symptoms and contact healthcare provider if she worsens. - Avoid using Flonase  while on Paxlovid.  Hypertension Currently managed with amlodipine  and losartan . Paxlovid may enhance amlodipine 's effects, increasing risk of hypotension. - Monitor blood pressure daily while on Paxlovid. - Consider holding amlodipine  if experiencing significant dizziness or hypotension symptoms and identify hypotension on at-home blood pressure cuff - Avoid using Flonase  while on Paxlovid.      Relevant Medications  nirmatrelvir/ritonavir (PAXLOVID) 20 x 150 MG & 10 x 100MG  TABS   promethazine -dextromethorphan (PROMETHAZINE -DM) 6.25-15 MG/5ML syrup   Assessment and  Plan Assessment & Plan Suspected COVID-19 infection Recent COVID exposure with symptoms suggestive of COVID-19. Likely COVID-19 given exposure and symptom onset. - Send prescription for Paxlovid to [facility]. - Advise to start Paxlovid within five days of symptom onset. - Recommend testing for COVID-19 before starting Paxlovid, if possible. - Continue Tylenol for symptom management. - Encourage hydration and light activity at home. - Monitor symptoms and contact healthcare provider if she worsens. - Avoid using Flonase  while on Paxlovid.  Hypertension Currently managed with amlodipine  and losartan . Paxlovid may enhance amlodipine 's effects, increasing risk of hypotension. - Monitor blood pressure daily while on Paxlovid. - Consider holding amlodipine  if experiencing significant dizziness or hypotension symptoms and identify hypotension on at-home blood pressure cuff - Avoid using Flonase  while on Paxlovid.    Return if symptoms worsen or fail to improve.    [name], NP

## 2023-11-19 NOTE — Assessment & Plan Note (Signed)
 Suspected COVID-19 infection Recent COVID exposure with symptoms suggestive of COVID-19. Likely COVID-19 given exposure and symptom onset. - Send prescription for Paxlovid to [facility]. - Advise to start Paxlovid within five days of symptom onset. - Recommend testing for COVID-19 before starting Paxlovid, if possible. - Continue Tylenol for symptom management. - Encourage hydration and light activity at home. - Monitor symptoms and contact healthcare provider if she worsens. - Avoid using Flonase  while on Paxlovid.  Hypertension Currently managed with amlodipine  and losartan . Paxlovid may enhance amlodipine 's effects, increasing risk of hypotension. - Monitor blood pressure daily while on Paxlovid. - Consider holding amlodipine  if experiencing significant dizziness or hypotension symptoms and identify hypotension on at-home blood pressure cuff - Avoid using Flonase  while on Paxlovid.

## 2023-11-19 NOTE — Telephone Encounter (Signed)
Pt is scheduled for an appt today.  

## 2023-12-03 ENCOUNTER — Other Ambulatory Visit: Payer: Self-pay | Admitting: Orthopedic Surgery

## 2023-12-20 ENCOUNTER — Encounter (HOSPITAL_BASED_OUTPATIENT_CLINIC_OR_DEPARTMENT_OTHER): Payer: Self-pay | Admitting: Orthopedic Surgery

## 2023-12-20 ENCOUNTER — Other Ambulatory Visit: Payer: Self-pay

## 2023-12-21 ENCOUNTER — Encounter (HOSPITAL_BASED_OUTPATIENT_CLINIC_OR_DEPARTMENT_OTHER)
Admission: RE | Admit: 2023-12-21 | Discharge: 2023-12-21 | Disposition: A | Discharge status: Home / Self Care | Source: Ambulatory Visit | Attending: Orthopedic Surgery | Admitting: Orthopedic Surgery

## 2023-12-21 DIAGNOSIS — Z01818 Encounter for other preprocedural examination: Secondary | ICD-10-CM | POA: Diagnosis present

## 2023-12-21 DIAGNOSIS — Z01810 Encounter for preprocedural cardiovascular examination: Secondary | ICD-10-CM | POA: Diagnosis not present

## 2023-12-21 NOTE — Progress Notes (Signed)

## 2023-12-22 NOTE — Anesthesia Preprocedure Evaluation (Addendum)
 Anesthesia Evaluation  Patient identified by MRN, date of birth, ID band Patient awake    Reviewed: Allergy & Precautions, NPO status , Patient's Chart, lab work & pertinent test results  Airway Mallampati: IV  TM Distance: >3 FB Neck ROM: Full    Dental  (+) Teeth Intact, Dental Advisory Given   Pulmonary neg pulmonary ROS   Pulmonary exam normal breath sounds clear to auscultation       Cardiovascular hypertension, Pt. on medications Normal cardiovascular exam Rhythm:Regular Rate:Normal     Neuro/Psych  PSYCHIATRIC DISORDERS Anxiety     negative neurological ROS     GI/Hepatic Neg liver ROS,GERD  Medicated and Controlled,,  Endo/Other  negative endocrine ROS    Renal/GU negative Renal ROS  negative genitourinary   Musculoskeletal  (+) Arthritis , Osteoarthritis,    Abdominal   Peds  Hematology negative hematology ROS (+)   Anesthesia Other Findings   Reproductive/Obstetrics negative OB ROS                              Anesthesia Physical Anesthesia Plan  ASA: 2  Anesthesia Plan: General   Post-op Pain Management: Tylenol PO (pre-op)*   Induction: Intravenous  PONV Risk Score and Plan: 3 and Ondansetron , Dexamethasone, Midazolam and Treatment may vary due to age or medical condition  Airway Management Planned: LMA  Additional Equipment: None  Intra-op Plan:   Post-operative Plan: Extubation in OR  Informed Consent: I have reviewed the patients History and Physical, chart, labs and discussed the procedure including the risks, benefits and alternatives for the proposed anesthesia with the patient or authorized representative who has indicated his/her understanding and acceptance.     Dental advisory given  Plan Discussed with: CRNA  Anesthesia Plan Comments:          Anesthesia Quick Evaluation

## 2023-12-27 ENCOUNTER — Other Ambulatory Visit: Payer: Self-pay

## 2023-12-27 ENCOUNTER — Ambulatory Visit (HOSPITAL_BASED_OUTPATIENT_CLINIC_OR_DEPARTMENT_OTHER): Payer: Self-pay | Admitting: Anesthesiology

## 2023-12-27 ENCOUNTER — Encounter (HOSPITAL_BASED_OUTPATIENT_CLINIC_OR_DEPARTMENT_OTHER)
Admission: RE | Disposition: A | Discharge status: Home / Self Care | Payer: Self-pay | Source: Home / Self Care | Attending: Orthopedic Surgery

## 2023-12-27 ENCOUNTER — Encounter (HOSPITAL_BASED_OUTPATIENT_CLINIC_OR_DEPARTMENT_OTHER): Payer: Self-pay | Admitting: Orthopedic Surgery

## 2023-12-27 ENCOUNTER — Ambulatory Visit (HOSPITAL_BASED_OUTPATIENT_CLINIC_OR_DEPARTMENT_OTHER)
Admission: RE | Admit: 2023-12-27 | Discharge: 2023-12-27 | Disposition: A | Discharge status: Home / Self Care | Attending: Orthopedic Surgery | Admitting: Orthopedic Surgery

## 2023-12-27 DIAGNOSIS — K219 Gastro-esophageal reflux disease without esophagitis: Secondary | ICD-10-CM | POA: Diagnosis not present

## 2023-12-27 DIAGNOSIS — Z79899 Other long term (current) drug therapy: Secondary | ICD-10-CM | POA: Insufficient documentation

## 2023-12-27 DIAGNOSIS — G5601 Carpal tunnel syndrome, right upper limb: Secondary | ICD-10-CM | POA: Insufficient documentation

## 2023-12-27 DIAGNOSIS — I10 Essential (primary) hypertension: Secondary | ICD-10-CM | POA: Diagnosis not present

## 2023-12-27 DIAGNOSIS — Z01818 Encounter for other preprocedural examination: Secondary | ICD-10-CM

## 2023-12-27 SURGERY — CARPAL TUNNEL RELEASE
Anesthesia: General | Site: Wrist | Laterality: Right

## 2023-12-27 MED ORDER — FENTANYL CITRATE (PF) 100 MCG/2ML IJ SOLN
INTRAMUSCULAR | Status: AC
  Filled 2023-12-27: qty 2

## 2023-12-27 MED ORDER — ONDANSETRON HCL 4 MG/2ML IJ SOLN
INTRAMUSCULAR | Status: DC | PRN
  Administered 2023-12-27: 4 mg via INTRAVENOUS

## 2023-12-27 MED ORDER — ACETAMINOPHEN 500 MG PO TABS
1000.0000 mg | ORAL_TABLET | Freq: Once | ORAL | Status: AC
  Administered 2023-12-27: 1000 mg via ORAL

## 2023-12-27 MED ORDER — BUPIVACAINE HCL (PF) 0.25 % IJ SOLN
INTRAMUSCULAR | Status: DC | PRN
  Administered 2023-12-27: 9 mL

## 2023-12-27 MED ORDER — HYDROCODONE-ACETAMINOPHEN 5-325 MG PO TABS: 1.0000 | ORAL_TABLET | Freq: Four times a day (QID) | ORAL | 0 refills | Status: DC | PRN

## 2023-12-27 MED ORDER — PHENYLEPHRINE 80 MCG/ML (10ML) SYRINGE FOR IV PUSH (FOR BLOOD PRESSURE SUPPORT)
PREFILLED_SYRINGE | INTRAVENOUS | Status: AC
  Filled 2023-12-27: qty 40

## 2023-12-27 MED ORDER — CEFAZOLIN SODIUM-DEXTROSE 2-4 GM/100ML-% IV SOLN
2.0000 g | INTRAVENOUS | Status: AC
  Administered 2023-12-27: 2 g via INTRAVENOUS

## 2023-12-27 MED ORDER — MIDAZOLAM HCL 2 MG/2ML IJ SOLN
INTRAMUSCULAR | Status: AC
  Filled 2023-12-27: qty 2

## 2023-12-27 MED ORDER — LACTATED RINGERS IV SOLN: INTRAVENOUS | Status: DC

## 2023-12-27 MED ORDER — ONDANSETRON HCL 4 MG/2ML IJ SOLN: 4.0000 mg | Freq: Once | INTRAMUSCULAR | Status: DC | PRN

## 2023-12-27 MED ORDER — PROPOFOL 10 MG/ML IV BOLUS
INTRAVENOUS | Status: AC
  Filled 2023-12-27: qty 20

## 2023-12-27 MED ORDER — ACETAMINOPHEN 500 MG PO TABS
ORAL_TABLET | ORAL | Status: AC
Start: 2023-12-27 — End: 2023-12-27
  Filled 2023-12-27: qty 2

## 2023-12-27 MED ORDER — KETOROLAC TROMETHAMINE 30 MG/ML IJ SOLN
INTRAMUSCULAR | Status: DC | PRN
Start: 2023-12-27 — End: 2023-12-27
  Administered 2023-12-27: 30 mg via INTRAVENOUS

## 2023-12-27 MED ORDER — LIDOCAINE HCL (CARDIAC) PF 100 MG/5ML IV SOSY
PREFILLED_SYRINGE | INTRAVENOUS | Status: DC | PRN
  Administered 2023-12-27: 80 mg via INTRAVENOUS

## 2023-12-27 MED ORDER — CEFAZOLIN SODIUM-DEXTROSE 2-4 GM/100ML-% IV SOLN
INTRAVENOUS | Status: AC
  Filled 2023-12-27: qty 100

## 2023-12-27 MED ORDER — MIDAZOLAM HCL 5 MG/5ML IJ SOLN
INTRAMUSCULAR | Status: DC | PRN
  Administered 2023-12-27: 1 mg via INTRAVENOUS

## 2023-12-27 MED ORDER — EPHEDRINE 5 MG/ML INJ
INTRAVENOUS | Status: AC
  Filled 2023-12-27: qty 10

## 2023-12-27 MED ORDER — DEXMEDETOMIDINE HCL IN NACL 80 MCG/20ML IV SOLN
INTRAVENOUS | Status: AC
Start: 2023-12-27 — End: 2023-12-27
  Filled 2023-12-27: qty 20

## 2023-12-27 MED ORDER — MIDAZOLAM HCL (PF) 2 MG/2ML IJ SOLN
2.0000 mg | Freq: Once | INTRAMUSCULAR | Status: AC
  Administered 2023-12-27: 2 mg via INTRAVENOUS

## 2023-12-27 MED ORDER — FENTANYL CITRATE (PF) 100 MCG/2ML IJ SOLN: 25.0000 ug | INTRAMUSCULAR | Status: DC | PRN

## 2023-12-27 MED ORDER — AMISULPRIDE (ANTIEMETIC) 5 MG/2ML IV SOLN: 10.0000 mg | Freq: Once | INTRAVENOUS | Status: DC | PRN

## 2023-12-27 MED ORDER — PROPOFOL 10 MG/ML IV BOLUS
INTRAVENOUS | Status: DC | PRN
Start: 2023-12-27 — End: 2023-12-27
  Administered 2023-12-27: 150 mg via INTRAVENOUS

## 2023-12-27 MED ORDER — ONDANSETRON HCL 4 MG/2ML IJ SOLN
INTRAMUSCULAR | Status: AC
  Filled 2023-12-27: qty 2

## 2023-12-27 MED ORDER — OXYCODONE HCL 5 MG/5ML PO SOLN: 5.0000 mg | Freq: Once | ORAL | Status: DC | PRN

## 2023-12-27 MED ORDER — DEXAMETHASONE SOD PHOSPHATE PF 10 MG/ML IJ SOLN
INTRAMUSCULAR | Status: DC | PRN
  Administered 2023-12-27: 5 mg via INTRAVENOUS

## 2023-12-27 MED ORDER — FENTANYL CITRATE (PF) 100 MCG/2ML IJ SOLN
INTRAMUSCULAR | Status: DC | PRN
  Administered 2023-12-27: 25 ug via INTRAVENOUS
  Administered 2023-12-27: 50 ug via INTRAVENOUS

## 2023-12-27 MED ORDER — LIDOCAINE 2% (20 MG/ML) 5 ML SYRINGE
INTRAMUSCULAR | Status: AC
  Filled 2023-12-27: qty 10

## 2023-12-27 MED ORDER — KETOROLAC TROMETHAMINE 30 MG/ML IJ SOLN
INTRAMUSCULAR | Status: AC
Start: 2023-12-27 — End: 2023-12-27
  Filled 2023-12-27: qty 1

## 2023-12-27 MED ORDER — OXYCODONE HCL 5 MG PO TABS: 5.0000 mg | ORAL_TABLET | Freq: Once | ORAL | Status: DC | PRN

## 2023-12-27 SURGICAL SUPPLY — 29 items
BLADE SURG 15 STRL LF DISP TIS (BLADE) ×2 IMPLANT
BNDG COMPR ESMARK 4X3 LF (GAUZE/BANDAGES/DRESSINGS) IMPLANT
BNDG ELASTIC 3INX 5YD STR LF (GAUZE/BANDAGES/DRESSINGS) ×1 IMPLANT
BNDG GAUZE DERMACEA FLUFF 4 (GAUZE/BANDAGES/DRESSINGS) ×1 IMPLANT
CHLORAPREP W/TINT 26 (MISCELLANEOUS) ×1 IMPLANT
CORD BIPOLAR FORCEPS 12FT (ELECTRODE) ×1 IMPLANT
COVER BACK TABLE 60X90IN (DRAPES) ×1 IMPLANT
COVER MAYO STAND STRL (DRAPES) ×1 IMPLANT
CUFF TOURN SGL QUICK 18X4 (TOURNIQUET CUFF) ×1 IMPLANT
DRAPE EXTREMITY T 121X128X90 (DISPOSABLE) ×1 IMPLANT
DRAPE SURG 17X23 STRL (DRAPES) ×1 IMPLANT
GAUZE PAD ABD 8X10 STRL (GAUZE/BANDAGES/DRESSINGS) ×1 IMPLANT
GAUZE SPONGE 4X4 12PLY STRL (GAUZE/BANDAGES/DRESSINGS) ×1 IMPLANT
GAUZE XEROFORM 1X8 LF (GAUZE/BANDAGES/DRESSINGS) ×1 IMPLANT
GLOVE BIO SURGEON STRL SZ7.5 (GLOVE) ×1 IMPLANT
GLOVE BIOGEL PI IND STRL 8 (GLOVE) ×1 IMPLANT
GOWN STRL REUS W/ TWL LRG LVL3 (GOWN DISPOSABLE) ×1 IMPLANT
GOWN STRL REUS W/TWL XL LVL3 (GOWN DISPOSABLE) ×1 IMPLANT
NDL HYPO 25X1 1.5 SAFETY (NEEDLE) ×1 IMPLANT
NEEDLE HYPO 25X1 1.5 SAFETY (NEEDLE) ×1 IMPLANT
PACK BASIN DAY SURGERY FS (CUSTOM PROCEDURE TRAY) ×1 IMPLANT
PADDING CAST ABS COTTON 4X4 ST (CAST SUPPLIES) ×1 IMPLANT
SOLN 0.9% NACL POUR BTL 1000ML (IV SOLUTION) ×1 IMPLANT
STOCKINETTE 4X48 STRL (DRAPES) ×1 IMPLANT
SUT ETHILON 4 0 PS 2 18 (SUTURE) ×1 IMPLANT
SYR BULB EAR ULCER 3OZ GRN STR (SYRINGE) ×1 IMPLANT
SYR CONTROL 10ML LL (SYRINGE) ×1 IMPLANT
TOWEL GREEN STERILE FF (TOWEL DISPOSABLE) ×2 IMPLANT
UNDERPAD 30X36 HEAVY ABSORB (UNDERPADS AND DIAPERS) ×1 IMPLANT

## 2023-12-27 NOTE — Op Note (Signed)
 12/27/2023 [facility]                              OPERATIVE REPORT   PREOPERATIVE DIAGNOSIS:  Right carpal tunnel syndrome  POSTOPERATIVE DIAGNOSIS:  Right carpal tunnel syndrome  PROCEDURE:  Right carpal tunnel release  SURGEON:  Franky Curia, MD  ASSISTANT:  Isaiah Anton, PAC.  ANESTHESIA: General  IV FLUIDS:  Per anesthesia flow sheet  ESTIMATED BLOOD LOSS:  Minimal  COMPLICATIONS:  None  SPECIMENS:  None  TOURNIQUET TIME:    Total Tourniquet Time Documented: Upper Arm (Right) - 12 minutes Total: Upper Arm (Right) - 12 minutes   DISPOSITION:  Stable to PACU  LOCATION: [facility]  INDICATIONS:  63 y.o. yo female with numbness and tingling right hand.  Nocturnal symptoms. Positive nerve conduction studies. She wishes to proceed with right carpal tunnel release.  Risks, benefits and alternatives of surgery were discussed including the risk of blood loss; infection; damage to nerves, vessels, tendons, ligaments, bone; failure of surgery; need for additional surgery; complications with wound healing; continued pain; recurrence of carpal tunnel syndrome; and damage to motor branch. She voiced understanding of these risks and elected to proceed.   OPERATIVE COURSE:  After being identified preoperatively by myself, the patient and I agreed upon the procedure and site of procedure.  The surgical site was marked.  Surgical consent had been signed.  She was given IV Ancef as preoperative antibiotic prophylaxis.  She was transferred to the operating room and placed on the operating room table in supine position with the Right upper extremity on an armboard.  General anesthesia was induced by the anesthesiologist.  Right upper extremity was prepped and draped in normal sterile orthopaedic fashion.  A surgical pause was performed between the surgeons, anesthesia, and operating room staff, and all were in agreement as to the patient, procedure, and site  of procedure.  Tourniquet at the proximal aspect of the extremity was inflated to 250 mmHg after exsanguination of the arm with an Esmarch bandage  Incision was made over the transverse carpal ligament and carried into the subcutaneous tissues by spreading technique.  Bipolar electrocautery was used to obtain hemostasis.  The palmar fascia was sharply incised.  The transverse carpal ligament was identified.  The fascia distal to the ligament was opened.  Retractor was placed and the flexor tendons were identified.  The flexor tendon to the ring finger was identified and retracted radially.  The transverse carpal ligament was then incised from distal to proximal under direct visualization.  Scissors were used to split the distal aspect of the volar antebrachial fascia.  A finger was placed into the wound to ensure complete decompression, which was the case.  The nerve was examined.  It was adherent to the radial leaflet.  The motor branch was identified and was intact.  The wound was copiously irrigated with sterile saline.  It was then closed with 4-0 nylon in a horizontal mattress fashion.  It was injected with 0.25% plain Marcaine to aid in postoperative analgesia.  It was dressed with sterile Xeroform, 4x4s, an ABD, and wrapped with Kerlix and an Ace bandage.  Tourniquet was deflated at 12 minutes.  Fingertips were pink with brisk capillary refill after deflation of the tourniquet.  Operative drapes were broken down.  The patient was awoken from anesthesia safely.  She was transferred back to stretcher and taken to the PACU  in stable condition.  I will see her back in the office in 1 week for postoperative followup.  I will give her a prescription for Norco 5/325 1 tab PO q6 hours prn pain, dispense #15.    Arriyana Rodell, MD Electronically signed, 12/27/23

## 2023-12-27 NOTE — Op Note (Signed)
 SURGERY ASSISTANT NOTE  PLACE OF SERVICE: [facility]   PATIENT INFORMATION: Name: Kathleen Weber MRN#: [id number] DOB: [birth year]  Date of surgery: 12/27/2023 Time of surgery: 3:52 PM  SURGERY ASSISTANT NOTE:  Assistant name: Isaiah Anton, PA-C Note date: 12/27/2023  I assisted Dr. Franky Curia on the following procedure(s) for the above-noted patient in the date and time documented:   Right carpal tunnel release    I provided assistance on the case as follows:  Assistance with exposure, retraction, bleeding control, protection of vital structures, instrumentation  and closure.   Isaiah Anton, PA-C

## 2023-12-27 NOTE — H&P (Signed)
 Kathleen Weber is an 63 y.o. female.   Chief Complaint: carpal tunnel syndrome HPI: 63 y.o. yo female with numbness and tingling right hand.  Nocturnal symptoms. Positive nerve conduction studies. She wishes to have right carpal tunnel release.   Allergies:  Allergies  Allergen Reactions   Bee Pollen Other (See Comments)    Per pt Per pt Per pt Per pt Per pt   Codeine  Other (See Comments)    Hot flash, nausea, feels faint   Pollen Extract Other (See Comments)    Per pt Per pt     Past Medical History:  Diagnosis Date   Allergy    Arthritis    Chicken pox    Gastritis    GERD (gastroesophageal reflux disease)    Hypertension    Neuropathy    Thyroid  nodule     Past Surgical History:  Procedure Laterality Date   neg hx      Family History: Family History  Problem Relation Age of Onset   Stomach cancer Mother    Heart disease Mother    Kidney disease Mother        kidney cancer   Hypertension Mother    Diabetes Father    Coronary artery disease Father 62       CABGx5   Stroke Father        TIAs   Hypertension Father    Lymphoma Maternal Grandmother    Colon cancer Neg Hx    Esophageal cancer Neg Hx    Rectal cancer Neg Hx     Social History:   reports that she has never smoked. She has never used smokeless tobacco. She reports that she does not drink alcohol and does not use drugs.  Medications: Medications Prior to Admission  Medication Sig Dispense Refill   amLODipine  (NORVASC ) 5 MG tablet TAKE 1 TABLET BY MOUTH EVERYDAY AT BEDTIME 90 tablet 0   aspirin EC 81 MG tablet Take 81 mg by mouth daily. Swallow whole.     Biotin 5 MG CAPS Take by mouth. Take 3 capsule daily     Ferrous Sulfate (IRON) 325 (65 FE) MG TABS Take 1 tablet by mouth daily.     losartan  (COZAAR ) 100 MG tablet TAKE 1 TABLET BY MOUTH ONCE EVERY DAY 90 tablet 0   OVER THE COUNTER MEDICATION B Complex vitamin. Once daily.     tretinoin  (RETIN-A ) 0.05 % cream APPLY TO AFFECTED AREA  EVERY DAY AT BEDTIME 45 g 1   VITAMIN D PO Take 1 capsule by mouth daily.     fluticasone  (FLONASE ) 50 MCG/ACT nasal spray SPRAY 1 SPRAY INTO BOTH NOSTRILS DAILY. 48 mL 5   promethazine -dextromethorphan (PROMETHAZINE -DM) 6.25-15 MG/5ML syrup Take 5 mLs by mouth 4 (four) times daily as needed for cough. 118 mL 0    No results found for this or any previous visit (from the past 48 hours).  No results found.    Blood pressure (!) 143/84, pulse 80, temperature 98.6 F (37 C), temperature source Tympanic, resp. rate 20, height 5' 6.5 (1.689 m), weight 79.5 kg, last menstrual period 06/19/2014, SpO2 97%.  General appearance: alert, cooperative, and appears stated age Head: Normocephalic, without obvious abnormality, atraumatic Neck: supple, symmetrical, trachea midline Extremities: Intact capillary refill all digits.  +epl/fpl/io.  No wounds.  Skin: Skin color, texture, turgor normal. No rashes or lesions Neurologic: Grossly normal Incision/Wound: none  Assessment/Plan Right carpal tunnel syndrome.  Non operative and operative treatment options have been discussed with  the patient and patient wishes to proceed with operative treatment. Risks, benefits, and alternatives of surgery have been discussed and the patient agrees with the plan of care.   Cullan Launer 12/27/2023, 1:12 PM

## 2023-12-27 NOTE — Discharge Instructions (Addendum)
 [facility] Center Instructions Hand Surgery  Wound Care: Keep your hand elevated above the level of your heart.  Do not allow it to dangle by your side.  Keep the dressing dry and do not remove it unless your doctor advises you to do so.  He will usually change it at the time of your post-op visit.  Moving your fingers is advised to stimulate circulation but will depend on the site of your surgery.  If you have a splint applied, your doctor will advise you regarding movement.  Activity: Do not drive or operate machinery today.  Rest today and then you may return to your normal activity and work as indicated by your physician.  Diet:  Drink liquids today or eat a light diet.  You may resume a regular diet tomorrow.    General expectations: Pain for two to three days. Fingers may become slightly swollen.  Call your doctor if any of the following occur: Severe pain not relieved by pain medication. Elevated temperature. Dressing soaked with blood. Inability to move fingers. White or bluish color to fingers.  Post Anesthesia Home Care Instructions  Activity: Get plenty of rest for the remainder of the day. A responsible individual must stay with you for 24 hours following the procedure.  For the next 24 hours, DO NOT: -Drive a car -Advertising copywriter -Drink alcoholic beverages -Take any medication unless instructed by your physician -Make any legal decisions or sign important papers.  Meals: Start with liquid foods such as gelatin or soup. Progress to regular foods as tolerated. Avoid greasy, spicy, heavy foods. If nausea and/or vomiting occur, drink only clear liquids until the nausea and/or vomiting subsides. Call your physician if vomiting continues.  Special Instructions/Symptoms: Your throat may feel dry or sore from the anesthesia or the breathing tube placed in your throat during surgery. If this causes discomfort, gargle with warm salt water. The discomfort should disappear within 24  hours.  If you had a scopolamine patch placed behind your ear for the management of post- operative nausea and/or vomiting:  1. The medication in the patch is effective for 72 hours, after which it should be removed.  Wrap patch in a tissue and discard in the trash. Wash hands thoroughly with soap and water. 2. You may remove the patch earlier than 72 hours if you experience unpleasant side effects which may include dry mouth, dizziness or visual disturbances. 3. Avoid touching the patch. Wash your hands with soap and water after contact with the patch.      Next dose of tylenol  may be taken at 7p if needed

## 2023-12-27 NOTE — Anesthesia Procedure Notes (Signed)
 Procedure Name: LMA Insertion Date/Time: 12/27/2023 3:26 PM  Performed by: Donnell Berwyn SQUIBB, CRNAPre-anesthesia Checklist: Patient identified, Emergency Drugs available, Suction available, Patient being monitored and Timeout performed Patient Re-evaluated:Patient Re-evaluated prior to induction Oxygen Delivery Method: Circle system utilized Preoxygenation: Pre-oxygenation with 100% oxygen Induction Type: IV induction Ventilation: Mask ventilation without difficulty LMA: LMA inserted LMA Size: 4.0 Number of attempts: 1 Placement Confirmation: positive ETCO2 and breath sounds checked- equal and bilateral Tube secured with: Tape

## 2023-12-27 NOTE — Anesthesia Postprocedure Evaluation (Signed)
 Anesthesia Post Note  Patient: Kathleen Weber  Procedure(s) Performed: CARPAL TUNNEL RELEASE (Right: Wrist)     Patient location during evaluation: PACU Anesthesia Type: General Level of consciousness: awake and alert Pain management: pain level controlled Vital Signs Assessment: post-procedure vital signs reviewed and stable Respiratory status: spontaneous breathing, nonlabored ventilation and respiratory function stable Cardiovascular status: blood pressure returned to baseline and stable Postop Assessment: no apparent nausea or vomiting Anesthetic complications: no   No notable events documented.  Last Vitals:  Vitals:   12/27/23 1602 12/27/23 1615  BP: 139/85 (!) 140/84  Pulse: 80 64  Resp: 15 12  Temp: (!) 36.3 C   SpO2: 99% 100%    Last Pain:  Vitals:   12/27/23 1615  TempSrc:   PainSc: 0-No pain                 Garnette [name]

## 2023-12-27 NOTE — Transfer of Care (Signed)
 Immediate Anesthesia Transfer of Care Note  Patient: Kathleen Weber  Procedure(s) Performed: CARPAL TUNNEL RELEASE (Right: Wrist)  Patient Location: PACU  Anesthesia Type:General  Level of Consciousness: awake and patient cooperative  Airway & Oxygen Therapy: Patient Spontanous Breathing and Patient connected to nasal cannula oxygen  Post-op Assessment: Report given to RN and Post -op Vital signs reviewed and stable  Post vital signs: Reviewed and stable  Last Vitals:  Vitals Value Taken Time  BP 139/85 12/27/23 16:02  Temp    Pulse 60 12/27/23 16:08  Resp 14 12/27/23 16:08  SpO2 100 % 12/27/23 16:08  Vitals shown include unfiled device data.  Last Pain:  Vitals:   12/27/23 1240  TempSrc: Tympanic  PainSc: 0-No pain      Patients Stated Pain Goal: 7 (12/27/23 1240)  Complications: No notable events documented.

## 2023-12-28 ENCOUNTER — Encounter (HOSPITAL_BASED_OUTPATIENT_CLINIC_OR_DEPARTMENT_OTHER): Payer: Self-pay | Admitting: Orthopedic Surgery

## 2024-01-05 ENCOUNTER — Other Ambulatory Visit

## 2024-01-05 DIAGNOSIS — I10 Essential (primary) hypertension: Secondary | ICD-10-CM

## 2024-01-05 DIAGNOSIS — E782 Mixed hyperlipidemia: Secondary | ICD-10-CM

## 2024-01-05 DIAGNOSIS — F419 Anxiety disorder, unspecified: Secondary | ICD-10-CM

## 2024-01-05 LAB — LIPID PANEL
Cholesterol: 188 mg/dL (ref 0–200)
HDL: 48.3 mg/dL (ref >39.00)
LDL Cholesterol: 120 mg/dL — ABNORMAL HIGH (ref 0–99)
NonHDL: 140.19
Total CHOL/HDL Ratio: 4
Triglycerides: 103 mg/dL (ref 0.0–149.0)
VLDL: 20.6 mg/dL (ref 0.0–40.0)

## 2024-01-05 LAB — COMPREHENSIVE METABOLIC PANEL WITH GFR
ALT: 11 U/L (ref 0–35)
AST: 18 U/L (ref 0–37)
Albumin: 4.3 g/dL (ref 3.5–5.2)
Alkaline Phosphatase: 61 U/L (ref 39–117)
BUN: 21 mg/dL (ref 6–23)
CO2: 29 meq/L (ref 19–32)
Calcium: 9.4 mg/dL (ref 8.4–10.5)
Chloride: 100 meq/L (ref 96–112)
Creatinine, Ser: 0.75 mg/dL (ref 0.40–1.20)
GFR: 84.38 mL/min (ref >60.00)
Glucose, Bld: 94 mg/dL (ref 70–99)
Potassium: 3.6 meq/L (ref 3.5–5.1)
Sodium: 139 meq/L (ref 135–145)
Total Bilirubin: 0.9 mg/dL (ref 0.2–1.2)
Total Protein: 7.4 g/dL (ref 6.0–8.3)

## 2024-01-05 LAB — CBC WITH DIFFERENTIAL/PLATELET
Basophils Absolute: 0 K/uL (ref 0.0–0.1)
Basophils Relative: 0.6 % (ref 0.0–3.0)
Eosinophils Absolute: 0.1 K/uL (ref 0.0–0.7)
Eosinophils Relative: 1.5 % (ref 0.0–5.0)
HCT: 41.4 % (ref 36.0–46.0)
Hemoglobin: 14.3 g/dL (ref 12.0–15.0)
Lymphocytes Relative: 38.3 % (ref 12.0–46.0)
Lymphs Abs: 1.9 K/uL (ref 0.7–4.0)
MCHC: 34.5 g/dL (ref 30.0–36.0)
MCV: 83.9 fl (ref 78.0–100.0)
Monocytes Absolute: 0.4 K/uL (ref 0.1–1.0)
Monocytes Relative: 7.6 % (ref 3.0–12.0)
Neutro Abs: 2.6 K/uL (ref 1.4–7.7)
Neutrophils Relative %: 52 % (ref 43.0–77.0)
Platelets: 184 K/uL (ref 150.0–400.0)
RBC: 4.93 Mil/uL (ref 3.87–5.11)
RDW: 12.9 % (ref 11.5–15.5)
WBC: 5 K/uL (ref 4.0–10.5)

## 2024-01-05 LAB — TSH: TSH: 0.65 u[IU]/mL (ref 0.35–5.50)

## 2024-01-05 LAB — HEMOGLOBIN A1C: Hgb A1c MFr Bld: 5.6 % (ref 4.6–6.5)

## 2024-01-09 ENCOUNTER — Encounter: Payer: Self-pay | Admitting: Internal Medicine

## 2024-01-09 NOTE — Assessment & Plan Note (Signed)
 S/p R CTS surgery 12/2023

## 2024-01-09 NOTE — Progress Notes (Unsigned)
 Subjective:    Patient ID: Kathleen Weber, female    DOB: 1960-11-23, 63 y.o.   MRN: [id number]      HPI Kathleen Weber is here for a Physical exam and her chronic medical problems.   Had labs done.  She is vegetarian.  She wants to lose weight.  Discussed weight loss - would consider GLP-1   Medications and allergies reviewed with patient and updated if appropriate.  Current Outpatient Medications on File Prior to Visit  Medication Sig Dispense Refill   amLODipine  (NORVASC ) 5 MG tablet TAKE 1 TABLET BY MOUTH EVERYDAY AT BEDTIME 90 tablet 0   aspirin EC 81 MG tablet Take 81 mg by mouth daily. Swallow whole.     Biotin 5 MG CAPS Take by mouth. Take 3 capsule daily     Ferrous Sulfate (IRON) 325 (65 FE) MG TABS Take 1 tablet by mouth daily.     fluticasone  (FLONASE ) 50 MCG/ACT nasal spray SPRAY 1 SPRAY INTO BOTH NOSTRILS DAILY. 48 mL 5   losartan  (COZAAR ) 100 MG tablet TAKE 1 TABLET BY MOUTH ONCE EVERY DAY 90 tablet 0   OVER THE COUNTER MEDICATION B Complex vitamin. Once daily.     VITAMIN D PO Take 1 capsule by mouth daily.     No current facility-administered medications on file prior to visit.    Review of Systems  Constitutional:  Negative for fever.  Eyes:  Negative for visual disturbance.  Respiratory:  Positive for chest tightness (occ - ? allergy related). Negative for cough, shortness of breath and wheezing.   Cardiovascular:  Positive for leg swelling (occ with inc'd salt). Negative for chest pain and palpitations.  Gastrointestinal:  Negative for abdominal pain, blood in stool, constipation and diarrhea (soft stools).       No gerd  Genitourinary:  Negative for dysuria.  Musculoskeletal:  Negative for arthralgias and back pain.  Skin:  Negative for rash.  Neurological:  Negative for light-headedness and headaches.  Psychiatric/Behavioral:  Negative for dysphoric mood. The patient is not nervous/anxious.        Objective:   Vitals:   01/11/24 0927  BP: 134/74   Pulse: 74  Temp: 98.3 F (36.8 C)  SpO2: 100%   Filed Weights   01/11/24 0927  Weight: 174 lb (78.9 kg)   Body mass index is 27.66 kg/m.  BP Readings from Last 3 Encounters:  01/11/24 134/74  12/27/23 (!) 146/91  03/23/23 130/78    Wt Readings from Last 3 Encounters:  01/11/24 174 lb (78.9 kg)  12/27/23 175 lb 4.3 oz (79.5 kg)  03/23/23 175 lb (79.4 kg)       Physical Exam Constitutional: She appears well-developed and well-nourished. No distress.  HENT:  Head: Normocephalic and atraumatic.  Right Ear: External ear normal. Normal ear canal and TM Left Ear: External ear normal.  Normal ear canal and TM Mouth/Throat: Oropharynx is clear and moist.  Eyes: Conjunctivae normal.  Neck: Neck supple. No tracheal deviation present. No thyromegaly present.  No carotid bruit  Cardiovascular: Normal rate, regular rhythm and normal heart sounds.   No murmur heard.  No edema. Pulmonary/Chest: Effort normal and breath sounds normal. No respiratory distress. She has no wheezes. She has no rales.  Breast: deferred   Abdominal: Soft. She exhibits no distension. There is no tenderness.  Lymphadenopathy: She has no cervical adenopathy.  Skin: Skin is warm and dry. She is not diaphoretic.  Psychiatric: She has a normal mood and affect. Her behavior  is normal.     Lab Results  Component Value Date   WBC 5.0 01/05/2024   HGB 14.3 01/05/2024   HCT 41.4 01/05/2024   PLT 184.0 01/05/2024   GLUCOSE 94 01/05/2024   CHOL 188 01/05/2024   TRIG 103.0 01/05/2024   HDL 48.30 01/05/2024   LDLCALC 120 (H) 01/05/2024   ALT 11 01/05/2024   AST 18 01/05/2024   NA 139 01/05/2024   K 3.6 01/05/2024   CL 100 01/05/2024   CREATININE 0.75 01/05/2024   BUN 21 01/05/2024   CO2 29 01/05/2024   TSH 0.65 01/05/2024   HGBA1C 5.6 01/05/2024         Assessment & Plan:   Physical exam: Screening blood work  ordered Exercise  inconsistent - stressed regular exercise Weight   overweight Substance abuse  none   Reviewed recommended immunizations.   Health Maintenance  Topic Date Due   COVID-19 Vaccine (3 - 2025-26 season) 01/27/2024 (Originally 10/11/2023)   Pneumococcal Vaccine: 50+ Years (1 of 1 - PCV) 01/10/2025 (Originally 12/29/2009)   Mammogram  04/21/2024   Cervical Cancer Screening (HPV/Pap Cotest)  04/23/2025   Colonoscopy  03/22/2026   DTaP/Tdap/Td (3 - Td or Tdap) 11/07/2028   Influenza Vaccine  Completed   Hepatitis C Screening  Completed   HIV Screening  Completed   Zoster Vaccines- Shingrix  Completed   Hepatitis B Vaccines 19-59 Average Risk  Aged Out   HPV VACCINES  Aged Out   Meningococcal B Vaccine  Aged Out          See Problem List for Assessment and Plan of chronic medical problems.

## 2024-01-09 NOTE — Patient Instructions (Addendum)

## 2024-01-11 ENCOUNTER — Ambulatory Visit: Admitting: Internal Medicine

## 2024-01-11 VITALS — BP 134/74 | HR 74 | Temp 98.3°F | Ht 66.5 in | Wt 174.0 lb

## 2024-01-11 DIAGNOSIS — Z789 Other specified health status: Secondary | ICD-10-CM | POA: Insufficient documentation

## 2024-01-11 DIAGNOSIS — I10 Essential (primary) hypertension: Secondary | ICD-10-CM | POA: Diagnosis not present

## 2024-01-11 DIAGNOSIS — Z136 Encounter for screening for cardiovascular disorders: Secondary | ICD-10-CM

## 2024-01-11 DIAGNOSIS — E7800 Pure hypercholesterolemia, unspecified: Secondary | ICD-10-CM

## 2024-01-11 DIAGNOSIS — G5603 Carpal tunnel syndrome, bilateral upper limbs: Secondary | ICD-10-CM | POA: Diagnosis not present

## 2024-01-11 DIAGNOSIS — R7303 Prediabetes: Secondary | ICD-10-CM

## 2024-01-11 DIAGNOSIS — Z0000 Encounter for general adult medical examination without abnormal findings: Secondary | ICD-10-CM | POA: Diagnosis not present

## 2024-01-11 MED ORDER — TRETINOIN 0.05 % EX CREA: TOPICAL_CREAM | CUTANEOUS | 1 refills | Status: AC

## 2024-01-11 NOTE — Assessment & Plan Note (Signed)
 Chronic LDL improved compared to last year Regular exercise and healthy diet  Continue lifestyle control  Lab Results  Component Value Date   LDLCALC 120 (H) 01/05/2024

## 2024-01-11 NOTE — Assessment & Plan Note (Signed)
 Chronic Blood pressure well controlled CMP normal Continue amlodipine  5 mg daily, losartan  100 mg daily

## 2024-01-11 NOTE — Assessment & Plan Note (Signed)
 Chronic Sugars improved-in the normal range over the last 3 months Lab Results  Component Value Date   HGBA1C 5.6 01/05/2024   Low sugar / carb diet Stressed regular exercise

## 2024-01-12 ENCOUNTER — Other Ambulatory Visit: Payer: Self-pay | Admitting: Internal Medicine

## 2024-01-17 ENCOUNTER — Other Ambulatory Visit (HOSPITAL_COMMUNITY): Payer: Self-pay

## 2024-01-17 ENCOUNTER — Telehealth: Payer: Self-pay

## 2024-01-17 DIAGNOSIS — L700 Acne vulgaris: Secondary | ICD-10-CM

## 2024-01-17 DIAGNOSIS — L709 Acne, unspecified: Secondary | ICD-10-CM | POA: Insufficient documentation

## 2024-01-17 NOTE — Telephone Encounter (Signed)
acne 

## 2024-01-17 NOTE — Assessment & Plan Note (Signed)
Tretinoin cream.

## 2024-01-17 NOTE — Telephone Encounter (Signed)
 Clinical questions answered and PA submitted.

## 2024-01-17 NOTE — Telephone Encounter (Signed)
 Pharmacy Patient Advocate Encounter   Received notification from Pt Calls Messages that prior authorization for Tretinoin  0.05% cream is required/requested.   Insurance verification completed.   The patient is insured through [facility].   Per test claim: PA required; PA started via CoverMyMeds. KEY [id number] . Please see clinical question(s) below that I am not finding the answer to in their chart and advise.   Waiting on a dx code

## 2024-01-17 NOTE — Telephone Encounter (Signed)
 Pharmacy Patient Advocate Encounter  Received notification from [facility] that Prior Authorization for Tretinoin  0.05% cream has been APPROVED from 01/17/24 to 01/17/27   PA #/Case ID/Reference #: [id number]

## 2024-01-23 ENCOUNTER — Other Ambulatory Visit: Payer: Self-pay | Admitting: Internal Medicine

## 2024-01-25 ENCOUNTER — Other Ambulatory Visit

## 2024-01-26 ENCOUNTER — Inpatient Hospital Stay (HOSPITAL_COMMUNITY)
Admission: RE | Admit: 2024-01-26 | Discharge: 2024-01-26 | Payer: Self-pay | Attending: Cardiology | Admitting: Cardiology

## 2024-01-26 DIAGNOSIS — Z136 Encounter for screening for cardiovascular disorders: Secondary | ICD-10-CM | POA: Insufficient documentation

## 2024-01-28 ENCOUNTER — Encounter: Payer: Self-pay | Admitting: Internal Medicine

## 2024-01-30 ENCOUNTER — Other Ambulatory Visit: Payer: Self-pay | Admitting: Internal Medicine

## 2024-01-30 DIAGNOSIS — I2510 Atherosclerotic heart disease of native coronary artery without angina pectoris: Secondary | ICD-10-CM

## 2024-01-30 DIAGNOSIS — I10 Essential (primary) hypertension: Secondary | ICD-10-CM

## 2024-02-20 ENCOUNTER — Ambulatory Visit: Payer: Self-pay | Admitting: Internal Medicine

## 2024-02-20 DIAGNOSIS — R918 Other nonspecific abnormal finding of lung field: Secondary | ICD-10-CM

## 2024-02-25 ENCOUNTER — Other Ambulatory Visit (HOSPITAL_COMMUNITY)

## 2024-03-09 ENCOUNTER — Encounter (HOSPITAL_BASED_OUTPATIENT_CLINIC_OR_DEPARTMENT_OTHER): Payer: Self-pay | Admitting: Pulmonary Disease

## 2024-03-09 ENCOUNTER — Ambulatory Visit (HOSPITAL_BASED_OUTPATIENT_CLINIC_OR_DEPARTMENT_OTHER): Admitting: Pulmonary Disease

## 2024-03-09 VITALS — BP 129/80 | HR 82 | Ht 66.0 in | Wt 180.0 lb

## 2024-03-09 DIAGNOSIS — R0609 Other forms of dyspnea: Secondary | ICD-10-CM | POA: Diagnosis not present

## 2024-03-09 DIAGNOSIS — R059 Cough, unspecified: Secondary | ICD-10-CM

## 2024-03-09 DIAGNOSIS — J984 Other disorders of lung: Secondary | ICD-10-CM

## 2024-03-09 DIAGNOSIS — R0602 Shortness of breath: Secondary | ICD-10-CM | POA: Insufficient documentation
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
## 2024-03-09 NOTE — Assessment & Plan Note (Deleted)
--  ORDER pulmonary function test --Will arrange follow-up

## 2024-03-09 NOTE — Patient Instructions (Addendum)
 Shortness of breath --ORDER pulmonary function test --Will arrange follow-up to discuss results  Atypical chronic lung infection --Minimal lung involvement. Do not suspect active flare --If work-up negative, consider bronchoscopy

## 2024-03-13 ENCOUNTER — Ambulatory Visit (HOSPITAL_BASED_OUTPATIENT_CLINIC_OR_DEPARTMENT_OTHER): Admitting: Cardiology

## 2024-03-13 ENCOUNTER — Encounter (HOSPITAL_BASED_OUTPATIENT_CLINIC_OR_DEPARTMENT_OTHER): Payer: Self-pay | Admitting: Cardiology

## 2024-03-13 VITALS — BP 124/82 | HR 78 | Ht 66.0 in | Wt 181.1 lb

## 2024-03-13 DIAGNOSIS — I2510 Atherosclerotic heart disease of native coronary artery without angina pectoris: Secondary | ICD-10-CM

## 2024-03-13 DIAGNOSIS — R0609 Other forms of dyspnea: Secondary | ICD-10-CM | POA: Diagnosis not present

## 2024-03-13 DIAGNOSIS — E7800 Pure hypercholesterolemia, unspecified: Secondary | ICD-10-CM | POA: Diagnosis not present

## 2024-03-13 DIAGNOSIS — Z7189 Other specified counseling: Secondary | ICD-10-CM

## 2024-03-13 MED ORDER — ROSUVASTATIN CALCIUM 5 MG PO TABS: 5.0000 mg | ORAL_TABLET | Freq: Every day | ORAL | 3 refills | Status: AC

## 2024-03-13 NOTE — Patient Instructions (Addendum)
 Medication Instructions:  Your physician has recommended you make the following change in your medication:  1.) start rosuvastatin  (Crestor ) 5 mg - take one tablet daily  *If you need a refill on your cardiac medications before your next appointment, please call your pharmacy*  Lab Work: In 3 months - come in for fasting lab work (lipids, liver function)   Testing/Procedures: none  Follow-Up: At [facility], you and your health needs are our priority.  As part of our continuing mission to provide you with exceptional heart care, our providers are all part of one team.  This team includes your primary Cardiologist (physician) and Advanced Practice Providers or APPs (Physician Assistants and Nurse Practitioners) who all work together to provide you with the care you need, when you need it.  Your next appointment:   12 month(s)  Provider:   Shelda Bruckner, MD, Rosaline Bane, NP, or Reche Finder, NP

## 2024-03-13 NOTE — Progress Notes (Signed)
 " Cardiology Office Note:  .   Date:  03/13/2024  ID:  Kathleen Weber, DOB Aug 31, 1960, MRN [id number] PCP: Geofm Glade PARAS, MD  [facility] Providers Cardiologist:  Shelda Bruckner, MD {  History of Present Illness: Kathleen   Celines Weber is a 64 y.o. female with PMH hypertension, hyperlipidemia, bilateral carpal tunnel disease, thyroid  nodule who is seen as a new patient consultation at the request of Dr. Geofm for CAD and hypertension  Referral from 01/30/24 reviewed. She was remotely seen by Dr. Loni in 2020 but has not been seen by cardiology since. At that time, chief complaint was racing heartbeats and shortness of breath. Echo done in 2020 was normal, with EF 60-65%, normal diastology, no significant valve disease. She had calcium  score done 02/2024 with score of 70.3, 79th percentile. 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 She saw Dr. Kassie for this last week.  Patient reports that she was having a lot of shortness of breath recently. After she had CT scan, she was referred to cardiology to discuss her calcium  score. Also saw pulmonologist recently, pending PFTs.   Shortness of breath has been worsening over the last few years but definitely worse over 6 mos or so. She feels that she has trouble walking and talking at the same time. Cold air triggers worsening her shortness of breath. She notes that she was trying to walk quickly on the treadmill, stopped and felt like she might pass out. Has had issues with coughing as well. Sometimes wakes up coughing, which then makes her feel like she can't breathe. No pain but feels like chest is tight when she is short of breath, but no pain.   Had Covid in the fall with lingering cough. No other recent infections.  Has had issues with palpitations/racing heartbeats in the past, related to eating large meals.   We reviewed her calcium  score. No  prior history of cholesterol medications. Reviewed cholesterol from 12/2023. LDL 120, discussed goal of <70. She is vegetarian but eats a lot of cheese.   ROS: Denies chest pain, orthopnea, LE edema or unexpected weight gain. No syncope. ROS otherwise negative except as noted.   Studies Reviewed: Kathleen    EKG:       Physical Exam:   VS:  BP 124/82   Pulse 78   Ht 5' 6 (1.676 m)   Wt 181 lb 1.6 oz (82.1 kg)   LMP 06/19/2014 Comment: currently irregular  SpO2 95%   BMI 29.23 kg/m    Wt Readings from Last 3 Encounters:  03/13/24 181 lb 1.6 oz (82.1 kg)  03/09/24 180 lb (81.6 kg)  01/11/24 174 lb (78.9 kg)    GEN: Well nourished, well developed in no acute distress HEENT: Normal, moist mucous membranes NECK: No JVD CARDIAC: regular rhythm, normal S1 and S2, no rubs or gallops. No murmur. VASCULAR: Radial and DP pulses 2+ bilaterally. No carotid bruits RESPIRATORY:  Clear to auscultation without rales, wheezing or rhonchi  ABDOMEN: Soft, non-tender, non-distended MUSCULOSKELETAL:  Ambulates independently SKIN: Warm and dry, no edema NEUROLOGIC:  Alert and oriented x 3. No focal neuro deficits noted. PSYCHIATRIC:  Normal affect    ASSESSMENT AND PLAN: .    Coronary calcification, consistent with nonobstructive CAD Hypercholesterolemia, LDL goal <70 -We reviewed the calcium  score at length. We discussed the pathophysiology of cholesterol plaque formation, the role of calcium  and why it is a marker,  how plaque is key to acute MI/CVA, and how known plaque is managed with medications.   -we discussed the data on statins, both in terms of their long term benefit as well as the risk of side effects. Reviewed common misconceptions about statins. Reviewed how we monitor treatment.  -After shared decision making, patient will try statin. She is already vegetarian, will work on avoiding cheese, increasing activity. We will start a low dose of rosuvastatin  to see if she tolerates and to see if  her LDL gets to less than 70. Will start with rosuvastatin  5 mg daily, then increase to 10 mg daily if not at goal. -repeat lipids and lfts in 3 mos -discussed guidelines recommending aspirin. She is taking aspirin intermittently now, discussed pros/cons. Discussed watching for signs of bleeding -reviewed red flag warning signs that need immediate medical attention   Shortness of breath on exertion -exercise and cold induced, pending PFTs -if lung workup unremarkable, then we discussed cardiac workup  CV risk counseling and prevention -recommend heart healthy/Mediterranean diet, with whole grains, fruits, vegetable, fish, lean meats, nuts, and olive oil. Limit salt. -recommend moderate walking, 3-5 times/week for 30-50 minutes each session. Aim for at least 150 minutes/week. Goal should be pace of 3 miles/hours, or walking 1.5 miles in 30 minutes -recommend avoidance of tobacco products. Avoid excess alcohol.  Dispo: 1 year if testing suggests asthma/lung, but will bring back sooner if no clear pulm etiology  Signed, Shelda Bruckner, MD   Shelda Bruckner, MD, PhD, [facility] [place]  [facility]  [place]  [facility] at [facility] [address] [place], [place] [address] [phone]   "

## 2024-03-14 ENCOUNTER — Other Ambulatory Visit: Payer: Self-pay | Admitting: Orthopedic Surgery

## 2024-03-16 ENCOUNTER — Ambulatory Visit (HOSPITAL_BASED_OUTPATIENT_CLINIC_OR_DEPARTMENT_OTHER)

## 2024-03-16 ENCOUNTER — Encounter (HOSPITAL_BASED_OUTPATIENT_CLINIC_OR_DEPARTMENT_OTHER)

## 2024-03-16 DIAGNOSIS — R0602 Shortness of breath: Secondary | ICD-10-CM

## 2024-03-16 NOTE — Patient Instructions (Signed)
 Full PFT performed today.

## 2024-03-16 NOTE — Progress Notes (Signed)
 Full PFT performed today.

## 2024-03-17 ENCOUNTER — Encounter (HOSPITAL_BASED_OUTPATIENT_CLINIC_OR_DEPARTMENT_OTHER): Payer: Self-pay | Admitting: Pulmonary Disease

## 2024-03-17 NOTE — Telephone Encounter (Signed)
Please advise on results when available

## 2024-04-06 ENCOUNTER — Ambulatory Visit (HOSPITAL_BASED_OUTPATIENT_CLINIC_OR_DEPARTMENT_OTHER): Admitting: Pulmonary Disease

## 2024-04-11 ENCOUNTER — Ambulatory Visit (HOSPITAL_BASED_OUTPATIENT_CLINIC_OR_DEPARTMENT_OTHER): Admit: 2024-04-11 | Admitting: Orthopedic Surgery

## 2024-04-11 ENCOUNTER — Encounter (HOSPITAL_BASED_OUTPATIENT_CLINIC_OR_DEPARTMENT_OTHER): Payer: Self-pay

## 2025-01-17 ENCOUNTER — Encounter: Admitting: Internal Medicine
